# Patient Record
Sex: Female | Born: 1995 | Hispanic: No | Marital: Single | Smoking: Never smoker
Health system: Southern US, Community
[De-identification: ages and names within clinical notes are randomized; demographics above are authoritative.]

## PROBLEM LIST (undated history)

## (undated) DIAGNOSIS — F329 Major depressive disorder, single episode, unspecified: Secondary | ICD-10-CM

## (undated) DIAGNOSIS — F411 Generalized anxiety disorder: Secondary | ICD-10-CM

## (undated) DIAGNOSIS — F32A Depression, unspecified: Secondary | ICD-10-CM

## (undated) DIAGNOSIS — G4733 Obstructive sleep apnea (adult) (pediatric): Secondary | ICD-10-CM

## (undated) DIAGNOSIS — M2550 Pain in unspecified joint: Secondary | ICD-10-CM

## (undated) DIAGNOSIS — J309 Allergic rhinitis, unspecified: Secondary | ICD-10-CM

## (undated) DIAGNOSIS — M1990 Unspecified osteoarthritis, unspecified site: Secondary | ICD-10-CM

## (undated) HISTORY — DX: Major depressive disorder, single episode, unspecified: F32.9

## (undated) HISTORY — DX: Allergic rhinitis, unspecified: J30.9

## (undated) HISTORY — DX: Generalized anxiety disorder: F41.1

## (undated) HISTORY — DX: Unspecified osteoarthritis, unspecified site: M19.90

## (undated) HISTORY — DX: Pain in unspecified joint: M25.50

## (undated) HISTORY — DX: Depression, unspecified: F32.A

## (undated) HISTORY — PX: SUPRAVENTRICULAR TACHYCARDIA ABLATION: SHX6106

## (undated) HISTORY — PX: WISDOM TOOTH EXTRACTION: SHX21

## (undated) HISTORY — DX: Obstructive sleep apnea (adult) (pediatric): G47.33

---

## 2014-01-23 ENCOUNTER — Ambulatory Visit (INDEPENDENT_AMBULATORY_CARE_PROVIDER_SITE_OTHER): Payer: Commercial Indemnity | Admitting: Nurse Practitioner

## 2014-01-23 ENCOUNTER — Encounter: Payer: Self-pay | Admitting: Nurse Practitioner

## 2014-01-23 VITALS — BP 124/80 | HR 79 | Temp 97.9°F | Wt 124.4 lb

## 2014-01-23 DIAGNOSIS — G4452 New daily persistent headache (NDPH): Secondary | ICD-10-CM | POA: Insufficient documentation

## 2014-01-23 DIAGNOSIS — R51 Headache: Secondary | ICD-10-CM

## 2014-01-23 DIAGNOSIS — R21 Rash and other nonspecific skin eruption: Secondary | ICD-10-CM

## 2014-01-23 DIAGNOSIS — R509 Fever, unspecified: Secondary | ICD-10-CM

## 2014-01-23 LAB — CBC WITH DIFFERENTIAL/PLATELET
BASOS PCT: 0.4 % (ref 0.0–3.0)
Basophils Absolute: 0 10*3/uL (ref 0.0–0.1)
EOS PCT: 3.1 % (ref 0.0–5.0)
Eosinophils Absolute: 0.2 10*3/uL (ref 0.0–0.7)
HEMATOCRIT: 37.6 % (ref 36.0–49.0)
Hemoglobin: 12.6 g/dL (ref 12.0–16.0)
Lymphocytes Relative: 33.2 % (ref 24.0–48.0)
Lymphs Abs: 2 10*3/uL (ref 0.7–4.0)
MCHC: 33.5 g/dL (ref 31.0–37.0)
MCV: 79.7 fl (ref 78.0–98.0)
MONO ABS: 0.2 10*3/uL (ref 0.1–1.0)
MONOS PCT: 3.6 % (ref 3.0–12.0)
Neutro Abs: 3.7 10*3/uL (ref 1.4–7.7)
Neutrophils Relative %: 59.7 % (ref 43.0–71.0)
Platelets: 360 10*3/uL (ref 150.0–575.0)
RBC: 4.72 Mil/uL (ref 3.80–5.70)
RDW: 13.3 % (ref 11.4–15.5)
WBC: 6.2 10*3/uL (ref 4.5–13.5)

## 2014-01-23 LAB — URINALYSIS, MICROSCOPIC ONLY: WBC, UA: NONE SEEN (ref 0–?)

## 2014-01-23 LAB — COMPREHENSIVE METABOLIC PANEL
ALBUMIN: 3.9 g/dL (ref 3.5–5.2)
ALT: 16 U/L (ref 0–35)
AST: 24 U/L (ref 0–37)
Alkaline Phosphatase: 67 U/L (ref 47–119)
BUN: 9 mg/dL (ref 6–23)
CO2: 23 mEq/L (ref 19–32)
Calcium: 9.5 mg/dL (ref 8.4–10.5)
Chloride: 103 mEq/L (ref 96–112)
Creatinine, Ser: 0.9 mg/dL (ref 0.4–1.2)
GFR: 85.17 mL/min (ref >60.00)
Glucose, Bld: 88 mg/dL (ref 70–99)
Potassium: 3.9 mEq/L (ref 3.5–5.1)
Sodium: 136 mEq/L (ref 135–145)
TOTAL PROTEIN: 7.2 g/dL (ref 6.0–8.3)
Total Bilirubin: 0.4 mg/dL (ref 0.2–1.2)

## 2014-01-23 LAB — SEDIMENTATION RATE: SED RATE: 20 mm/h (ref 0–22)

## 2014-01-23 MED ORDER — TRIAMCINOLONE ACETONIDE 0.1 % EX CREA: 1.0000 "application " | TOPICAL_CREAM | Freq: Two times a day (BID) | CUTANEOUS | Status: DC

## 2014-01-23 NOTE — Progress Notes (Signed)
Pre visit review using our clinic review tool, if applicable. No additional management support is needed unless otherwise documented below in the visit note. 

## 2014-01-23 NOTE — Patient Instructions (Addendum)
Use steroid cream on rash as instructed.  Continue to use Dove bar soap.  Please see dermatology.  We will discuss labs at follow up appt. Next week.  Nice to meet you!

## 2014-01-23 NOTE — Progress Notes (Signed)
Subjective:     Tina Ortiz is a 18 y.o. female who presents w/c/o rash on legs & arms that started 3 weeks ago accompanied by fatigue, low grade fever, & HA. Last fever was 1 week ago. HA is persistent, but relieved by 1000 mg tylenol. She denies cough, SOB, palpitations, sore throat, runny nose. She denies rash r/t sun exposure, no diarrhea or nausea, no sores in mouth or nose. She also has a firm raised dome-shaped lesion on R upper thigh red w/layer of peeling skin. Pt says lesion has been there for 9 years. She has taken oral ABX & used ABX creams w/no relief. Of note: her father & PGM have SLE.   The following portions of the patient's history were reviewed and updated as appropriate: allergies, current medications, past family history, past medical history, past social history, past surgical history and problem list.  Review of Systems Pertinent items are noted in HPI.    Objective:    BP 124/80  Pulse 79  Temp(Src) 97.9 F (36.6 C) (Oral)  Wt 124 lb 6.4 oz (56.427 kg)  SpO2 100% BP 124/80  Pulse 79  Temp(Src) 97.9 F (36.6 C) (Oral)  Wt 124 lb 6.4 oz (56.427 kg)  SpO2 100% General appearance: alert, cooperative, appears stated age and no distress Head: Normocephalic, without obvious abnormality, atraumatic Eyes: negative findings: lids and lashes normal, conjunctivae and sclerae normal, corneas clear and pupils equal, round, reactive to light and accomodation Ears: normal TM's and external ear canals both ears Throat: lips, mucosa, and tongue normal; teeth and gums normal Neck: no carotid bruit, supple, symmetrical, trachea midline and thyroid not enlarged, symmetric, no tenderness/mass/nodules Lungs: clear to auscultation bilaterally Heart: regular rate and rhythm, S1, S2 normal, no murmur, click, rub or gallop Abdomen: soft, non-tender; bowel sounds normal; no masses,  no organomegaly Extremities: extremities normal, atraumatic, no cyanosis or edema Pulses: 2+ and  symmetric Skin: lesion upper thigh: acanthoma or pyogenic granuloma? superficial raised lesions LE, back, arms new & old, some scabbed over. New ones look like small hive w/central dot. Lymph nodes: Cervical adenopathy: shoddy anterior cervical nodes, NT, moveable.    Assessment:  1. Low grade fever - T4, free - TSH - Urine Microscopic - Vit D  25 hydroxy (rtn osteoporosis monitoring) - Comprehensive metabolic panel - Rheumatoid factor - C3 and C4 - Antinuclear Antibodies, IFA - RPR - HIV antibody - Sedimentation rate - CBC with Differential - ENA 9 Panel  2. Rash and nonspecific skin eruption - T4, free - TSH - Urine Microscopic - Vit D  25 hydroxy (rtn osteoporosis monitoring) - Comprehensive metabolic panel - Rheumatoid factor - C3 and C4 - Antinuclear Antibodies, IFA - RPR - HIV antibody - Sedimentation rate - CBC with Differential - ENA 9 Panel - triamcinolone cream (KENALOG) 0.1 %; Apply 1 application topically 2 (two) times daily. Use sparingly as can cause bleaching and atrophy.  Dispense: 45 g; Refill: 0 - Ambulatory referral to Dermatology  3. Headache(784.0) - T4, free - TSH - Urine Microscopic - Vit D  25 hydroxy (rtn osteoporosis monitoring) - Comprehensive metabolic panel - Rheumatoid factor - C3 and C4 - Antinuclear Antibodies, IFA - RPR - HIV antibody - Sedimentation rate - CBC with Differential - ENA 9 Panel  Screen for HIV, syphillis, SLE F/u 1 week-discuss labs, eval rash for steroid responsiveness

## 2014-01-24 ENCOUNTER — Telehealth: Payer: Self-pay | Admitting: Nurse Practitioner

## 2014-01-24 LAB — RPR

## 2014-01-24 LAB — ENA 9 PANEL
CENTROMERE AB SCREEN: NEGATIVE
ENA SM AB SER-ACNC: NEGATIVE
JO-1 ANTIBODY, IGG: NEGATIVE
RIBOSOMAL P PROTEIN AB: NEGATIVE
SM/RNP: <1
SSA (Ro) (ENA) Antibody, IgG: <1
SSB (La) (ENA) Antibody, IgG: <1
Scleroderma (Scl-70) (ENA) Antibody, IgG: <1

## 2014-01-24 LAB — C3 AND C4
C3 COMPLEMENT: 127 mg/dL (ref 90–180)
C4 Complement: 21 mg/dL (ref 10–40)

## 2014-01-24 LAB — HIV ANTIBODY (ROUTINE TESTING W REFLEX): HIV: NONREACTIVE

## 2014-01-24 LAB — TSH: TSH: 1.87 u[IU]/mL (ref 0.40–5.00)

## 2014-01-24 LAB — VITAMIN D 25 HYDROXY (VIT D DEFICIENCY, FRACTURES): VITD: 18.05 ng/mL — ABNORMAL LOW (ref 30.00–100.00)

## 2014-01-24 LAB — ANTINUCLEAR ANTIBODIES, IFA: ANA Titer 1: NEGATIVE

## 2014-01-24 LAB — T4, FREE: FREE T4: 0.99 ng/dL (ref 0.60–1.60)

## 2014-01-24 LAB — RHEUMATOID FACTOR: Rhuematoid fact SerPl-aCnc: <10 IU/mL (ref <=14)

## 2014-01-24 NOTE — Telephone Encounter (Signed)
Neg screen for SLE, syphillis & HIV. Sed rate elevated. Will f/u in ofc next week. LM for pt.

## 2014-01-25 ENCOUNTER — Telehealth: Payer: Self-pay | Admitting: *Deleted

## 2014-01-25 NOTE — Telephone Encounter (Signed)
Patient returned call and was given results.  

## 2014-02-03 ENCOUNTER — Encounter: Payer: Self-pay | Admitting: Nurse Practitioner

## 2014-02-03 ENCOUNTER — Ambulatory Visit (INDEPENDENT_AMBULATORY_CARE_PROVIDER_SITE_OTHER): Payer: Commercial Indemnity | Admitting: Nurse Practitioner

## 2014-02-03 VITALS — BP 113/70 | HR 66 | Temp 98.7°F | Ht 62.0 in | Wt 125.2 lb

## 2014-02-03 DIAGNOSIS — M542 Cervicalgia: Secondary | ICD-10-CM

## 2014-02-03 DIAGNOSIS — R312 Other microscopic hematuria: Secondary | ICD-10-CM

## 2014-02-03 DIAGNOSIS — R3129 Other microscopic hematuria: Secondary | ICD-10-CM | POA: Insufficient documentation

## 2014-02-03 DIAGNOSIS — G4452 New daily persistent headache (NDPH): Secondary | ICD-10-CM

## 2014-02-03 DIAGNOSIS — R21 Rash and other nonspecific skin eruption: Secondary | ICD-10-CM

## 2014-02-03 DIAGNOSIS — E559 Vitamin D deficiency, unspecified: Secondary | ICD-10-CM | POA: Insufficient documentation

## 2014-02-03 LAB — URINALYSIS, MICROSCOPIC ONLY

## 2014-02-03 NOTE — Progress Notes (Signed)
Pre visit review using our clinic review tool, if applicable. No additional management support is needed unless otherwise documented below in the visit note. 

## 2014-02-03 NOTE — Progress Notes (Signed)
Subjective:     Tina Ortiz is a 18 y.o. female who presents for f/u of associated with HA & low grade fever. She was seen 2 weeks ago. AI w/u performed as father has SLE. W/u neg for SLE, but sed rate 20, few RBC in urine. I referred to derm & prescribed steroid cream.  Today, states HA are still persistent & tylenol is not working as well. HA wake from sleep & persiste throughout day.  Fevers have resolved, rash is persistent, but has changed-no longer starts as pink scaly lesions. Now they start as papules. They are no longer itchy. She has new complaint of neck pain & pops neck several times daily.  The following portions of the patient's history were reviewed and updated as appropriate: allergies, current medications, past family history, past medical history, past social history, past surgical history and problem list.  Review of Systems Pertinent items are noted in HPI.    Objective:    BP 113/70  Pulse 66  Temp(Src) 98.7 F (37.1 C) (Temporal)  Ht 5\' 2"  (1.575 m)  Wt 125 lb 4 oz (56.813 kg)  BMI 22.90 kg/m2  SpO2 100% General:  alert, cooperative, appears stated age and no distress  Skin:  Lungs: Heart: Back:  Lymph nodes Neuro: Eyes:  post-inflammatory changes where old papules were on legs, new cluster of papules on arm-no vesicles visible. Clear RRR, no murmur No spinal tenderness, tendre adjacent to spine on L side over traps & splenius capitis  No cervical & [place] LAD Cranial nerves grossly intact, nml rapid-alternating movements PERRL     Assessment:   1. New daily persistent headache - HSV(herpes simplex vrs) 1+2 ab-IgG - HSV(herpes simplex vrs) 1+2 ab-IgM - Rocky mtn spotted fvr abs pnl(IgG+IgM) - B. [place] Antibodies by WB  2. Hematuria, microscopic - Urine Microscopic  3. Vitamin D deficiency  4. Rash and nonspecific skin eruption  5. Neck pain, musculoskeletal  See problem list for complete A&P See pt instructions. F/u 2-3 weeks

## 2014-02-03 NOTE — Assessment & Plan Note (Addendum)
Rash has changed, no longer pink scaly spots, now starting as clusters of papules. Fewer lesions than intiially. HSV? Will screen. SLE w/u neg, sed rate 20. Will monitor.

## 2014-02-03 NOTE — Assessment & Plan Note (Signed)
Repeat today

## 2014-02-03 NOTE — Assessment & Plan Note (Signed)
No nuchal rigidity. MSK etiology. Neck stretches & ibuprophen. F/u 2 weeks.

## 2014-02-03 NOTE — Assessment & Plan Note (Signed)
SLE w/u neg, but sed rate 20. Look for infectious source, given she reported fever when 1st started, now resolved. Will screen for HSV, RMSF, Lyme. Discussed HA journal, sleep routine, hydration. F/u 2 weeks.

## 2014-02-03 NOTE — Patient Instructions (Signed)
Start Vitamin D3 daily 5000 iu with meal.  I will call with lab results.  Keep appointment for dermatology.  Start ibuprophen for headache. Take 2 to 3 tablets twice daily with food.  There are many triggers for headaches: lack of sleep, too much sleep, food allergies, hormones, stress, poor posture, not enough exercise, dehydration. Try to go to sleep & wake up at within 30 minutes of same time daily. If I cannot find an infectious source for headache, or a musculoskeletal cause, and headaches are not relieved by over-the- counter meds, I will consider ordering head CT scan.  Please record headache journal foods & beverages, sleep times, recreational drugs, alcohol intake within 24 hours of headaches. Perhaps we can find a pattern or trigger.  Do neck & shoulder exercises daily. Visit you tube for these exercise regimens: "physio Neck Exercises: Stretch & Relieve routine" by Dyane DustmanMichelle Kenway; "Pilates Seated Stretch" by [employer]; "Physio med Neck & Upper body stretch Exercises-Occupational Physiotherapy". Use muscle relaxer at night. Get up every hour while at work or studying & do neck & shoulder exercises-take 5-7 minutes.   See you in 2 weeks.

## 2014-02-03 NOTE — Assessment & Plan Note (Signed)
Start 5000 iu D3 daily w/meal X 12 weeks.

## 2014-02-06 LAB — HSV(HERPES SIMPLEX VRS) I + II AB-IGG
HSV 1 Glycoprotein G Ab, IgG: <0.1 IV
HSV 2 Glycoprotein G Ab, IgG: <0.1 IV

## 2014-02-06 LAB — HSV(HERPES SIMPLEX VRS) I + II AB-IGM: HERPES SIMPLEX VRS I-IGM AB (EIA): 0.56 {index}

## 2014-02-07 LAB — ROCKY MTN SPOTTED FVR ABS PNL(IGG+IGM)
RMSF IGG: 0.1 IV
RMSF IgM: 0.48 IV

## 2014-02-08 ENCOUNTER — Telehealth: Payer: Self-pay | Admitting: Nurse Practitioner

## 2014-02-08 LAB — B. BURGDORFI ANTIBODIES BY WB
B BURGDORFERI IGG ABS (IB): NEGATIVE
B BURGDORFERI IGM ABS (IB): NEGATIVE

## 2014-02-08 NOTE — Telephone Encounter (Signed)
pls call pt: Advise Labs for infectious source of HA are all neg. I checked lyme, herpes simplex, & [place] spotted fever.  Please ask if still having HA. Has ibuprophen helped? How long has she been on current birth control?  I will discuss further at return appointment. Bring HA journal.

## 2014-02-09 NOTE — Telephone Encounter (Addendum)
Patient notified of results. Patient stated that the Advil helps with HA, however she is still having them. Patient wanted to know if you thought flea bites could cause infection and HA symptoms? Patient also stated that she has been on curent birth control since 04/2013.

## 2014-02-10 NOTE — Telephone Encounter (Signed)
Patient notified

## 2014-02-10 NOTE — Telephone Encounter (Signed)
No infection w/flea bites that would cause HA. I will discuss with her further at follow up. Perhaps she has fall allergies. She can try claritin daily.

## 2014-02-15 ENCOUNTER — Ambulatory Visit (INDEPENDENT_AMBULATORY_CARE_PROVIDER_SITE_OTHER): Payer: Commercial Indemnity | Admitting: Nurse Practitioner

## 2014-02-15 ENCOUNTER — Encounter: Payer: Self-pay | Admitting: Nurse Practitioner

## 2014-02-15 VITALS — BP 95/61 | HR 80 | Temp 98.0°F | Ht 62.0 in | Wt 127.0 lb

## 2014-02-15 DIAGNOSIS — R3129 Other microscopic hematuria: Secondary | ICD-10-CM

## 2014-02-15 DIAGNOSIS — R519 Headache, unspecified: Secondary | ICD-10-CM | POA: Insufficient documentation

## 2014-02-15 DIAGNOSIS — R51 Headache: Secondary | ICD-10-CM

## 2014-02-15 DIAGNOSIS — S80869A Insect bite (nonvenomous), unspecified lower leg, initial encounter: Secondary | ICD-10-CM

## 2014-02-15 DIAGNOSIS — W57XXXA Bitten or stung by nonvenomous insect and other nonvenomous arthropods, initial encounter: Secondary | ICD-10-CM

## 2014-02-15 DIAGNOSIS — G44209 Tension-type headache, unspecified, not intractable: Secondary | ICD-10-CM

## 2014-02-15 DIAGNOSIS — R312 Other microscopic hematuria: Secondary | ICD-10-CM

## 2014-02-15 DIAGNOSIS — E559 Vitamin D deficiency, unspecified: Secondary | ICD-10-CM

## 2014-02-15 NOTE — Patient Instructions (Signed)
Continue to use ibuprophen as needed for Headache. Take it with food.  Stay hydrated to avoid HA.  Return in 2 to 3 months for vitamin D check & birth control.  Keep taking Vitamin D daily with food.   Nice to see you!

## 2014-02-15 NOTE — Assessment & Plan Note (Addendum)
Monitor in 6 mos to 1 yr.

## 2014-02-15 NOTE — Progress Notes (Signed)
Pre visit review using our clinic review tool, if applicable. No additional management support is needed unless otherwise documented below in the visit note. 

## 2014-02-15 NOTE — Progress Notes (Signed)
Subjective:     Tina Ortiz is a 18 y.o. female presents for follow up of daily headaches & rash on lower extremities. She has figured out that rash is from flea bites. 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 She has fewer lesions. HA are less frequent-decreased from daily to  2 to 3 week. She is using ibuprophen rather than tylenol and getting complete relief. She wonders if HA were r/t stress of not knowing what was causing rash. Pain is described as across forehead. & back of neck. Mostly occur now when at work. We discussed birth control. She has no risk factors for stroke. She will return in January for refill. She is taking Vitamin D daily. I will check level when she returns in Jan., 2016.  The following portions of the patient's history were reviewed and updated as appropriate: allergies, current medications, past family history, past medical history, past social history, past surgical history and problem list.  Review of Systems Pertinent items are noted in HPI.    Objective:    BP 95/61  Pulse 80  Temp(Src) 98 F (36.7 C) (Temporal)  Ht 5\' 2"  (1.575 m)  Wt 127 lb (57.607 kg)  BMI 23.22 kg/m2  SpO2 99% BP 95/61  Pulse 80  Temp(Src) 98 F (36.7 C) (Temporal)  Ht 5\' 2"  (1.575 m)  Wt 127 lb (57.607 kg)  BMI 23.22 kg/m2  SpO2 99% General appearance: alert, cooperative, appears stated age and no distress Head: Normocephalic, without obvious abnormality, atraumatic Eyes: negative findings: lids and lashes normal and conjunctivae and sclerae normal    Assessment:   1. Tension-type headache, not intractable, unspecified chronicity pattern  2. Flea bite of lower leg, unspecified laterality, initial encounter  3. Vitamin D deficiency See problem list for complete A&P See pt instructions. F/u 3 mos check Vit D, sed rate.

## 2014-02-15 NOTE — Assessment & Plan Note (Signed)
Complete relief w/ ibuprophen Decrease in frequency now that she knows what rash is coming from-fleas: 2/week.

## 2014-02-15 NOTE — Assessment & Plan Note (Signed)
Continue 5000 iu daily Check level in 3 mos.

## 2014-05-03 ENCOUNTER — Ambulatory Visit (INDEPENDENT_AMBULATORY_CARE_PROVIDER_SITE_OTHER): Payer: Commercial Indemnity | Admitting: Nurse Practitioner

## 2014-05-03 ENCOUNTER — Encounter: Payer: Self-pay | Admitting: Nurse Practitioner

## 2014-05-03 VITALS — BP 107/68 | HR 77 | Temp 98.2°F | Ht 62.0 in | Wt 123.0 lb

## 2014-05-03 DIAGNOSIS — R3129 Other microscopic hematuria: Secondary | ICD-10-CM

## 2014-05-03 DIAGNOSIS — M542 Cervicalgia: Secondary | ICD-10-CM

## 2014-05-03 DIAGNOSIS — R312 Other microscopic hematuria: Secondary | ICD-10-CM

## 2014-05-03 DIAGNOSIS — Z309 Encounter for contraceptive management, unspecified: Secondary | ICD-10-CM

## 2014-05-03 DIAGNOSIS — M6248 Contracture of muscle, other site: Secondary | ICD-10-CM

## 2014-05-03 DIAGNOSIS — M62838 Other muscle spasm: Secondary | ICD-10-CM

## 2014-05-03 DIAGNOSIS — E559 Vitamin D deficiency, unspecified: Secondary | ICD-10-CM

## 2014-05-03 DIAGNOSIS — IMO0001 Reserved for inherently not codable concepts without codable children: Secondary | ICD-10-CM | POA: Insufficient documentation

## 2014-05-03 LAB — URINALYSIS, MICROSCOPIC ONLY

## 2014-05-03 LAB — VITAMIN D 25 HYDROXY (VIT D DEFICIENCY, FRACTURES): VITD: 18.66 ng/mL — ABNORMAL LOW (ref 30.00–100.00)

## 2014-05-03 MED ORDER — LEVONORGESTREL-ETHINYL ESTRAD 0.1-20 MG-MCG PO TABS: 1.0000 | ORAL_TABLET | Freq: Every day | ORAL | Status: DC

## 2014-05-03 NOTE — Progress Notes (Signed)
Subjective:     Tina Ortiz is a 19 y.o. female presents for f/u of vit d def, contraception management. She c/o persistent HA associated w/neck pain. Vit D def: takes 5000 iu most days. No intol SE. Contraception Management: No intol SE, wishes to continue HA: occur back of head 2-3 times/week. Associated w/neck & trap pain. Denies aura & nausea. Complete relief w/2 T ibuprophen, sometimes has to repeat dose in 4 hours. Denies paresthesia in arms/hands. Stretches about every 2h-provides some relief. Cannot ID triggers. Just had eye exam 3 weeks ago, slight change in prescription.  The following portions of the patient's history were reviewed and updated as appropriate: allergies, current medications, past medical history, past social history, past surgical history and problem list.  Review of Systems Constitutional: negative for fatigue Respiratory: negative for cough and dyspnea on exertion Cardiovascular: negative for palpitations Integument/breast: positive for post-inflam changes from flea bites-arms & legs Neurological: negative for coordination problems and dizziness    Objective:    BP 107/68 mmHg  Pulse 77  Temp(Src) 98.2 F (36.8 C) (Temporal)  Ht 5\' 2"  (1.575 m)  Wt 123 lb (55.792 kg)  BMI 22.49 kg/m2  SpO2 100%  LMP 04/19/2014 General appearance: alert, cooperative, appears stated age and no distress Head: Normocephalic, without obvious abnormality, atraumatic Eyes: negative findings: lids and lashes normal and conjunctivae and sclerae normal Back: Full ROM neck & shoulders. No spinal tenderness.tender over bilat trap.    Assessment:Plan     1. Vitamin D deficiency - Vit D  25 hydroxy (rtn osteoporosis monitoring)  2. Hematuria, microscopic - Urine Microscopic  3. Encounter for contraceptive management, unspecified encounter - levonorgestrel-ethinyl estradiol (SRONYX) 0.1-20 MG-MCG tablet; Take 1 tablet by mouth daily.  Dispense: 1 Package; Refill: 11  4.  Trapezius muscle spasm - Ambulatory referral to Physical Therapy  See problem list for complete A&P See pt instructions. F/u 1 yr

## 2014-05-03 NOTE — Progress Notes (Signed)
Pre visit review using our clinic review tool, if applicable. No additional management support is needed unless otherwise documented below in the visit note. 

## 2014-05-03 NOTE — Assessment & Plan Note (Signed)
Taking 5000 iu daily, forgets some days No intolerable SE

## 2014-05-03 NOTE — Assessment & Plan Note (Signed)
No symptoms [facility] today [facility]

## 2014-05-03 NOTE — Assessment & Plan Note (Signed)
Persistent pain, relieved by ibuprophen Associated w/HA Doing stretches-minimally helpful Use heat Posture-head up, shoulders back Ref to PT

## 2014-05-03 NOTE — Assessment & Plan Note (Signed)
No intolerable SE Wishes to continue.

## 2014-05-03 NOTE — Patient Instructions (Addendum)
Use heat on shoulders & neck. Be aware of posture-keep head up & pull shoulders back. Frequent stretches of head & neck. See physical therapist.  Continue vitamin D daily with meal.  I will see you in 1 year.

## 2014-05-04 ENCOUNTER — Telehealth: Payer: Self-pay | Admitting: Nurse Practitioner

## 2014-05-04 NOTE — Telephone Encounter (Signed)
LMOVM for pt to return call 

## 2014-05-04 NOTE — Addendum Note (Signed)
Addended by: Cydney OkAUGUSTIN, Iyesha Such N on: 05/04/2014 08:38 AM   Modules accepted: Orders

## 2014-05-04 NOTE — Telephone Encounter (Signed)
pls call pt: Advise Vit D too low. Must take 5000 iu qd with meal. If she tends to forget, take 2 capsules when she remembers. Urine has bacteria, but may be from vaginal secretions. I sent for culture to screen for uti. We will call if she has infection & needs ABX.

## 2014-05-05 LAB — URINE CULTURE
Colony Count: NO GROWTH
Organism ID, Bacteria: NO GROWTH

## 2014-05-05 NOTE — Telephone Encounter (Signed)
Patient notified of results.

## 2014-05-06 ENCOUNTER — Other Ambulatory Visit: Payer: Self-pay | Admitting: Advanced Practice Midwife

## 2014-05-08 ENCOUNTER — Telehealth: Payer: Self-pay | Admitting: Nurse Practitioner

## 2014-05-08 NOTE — Telephone Encounter (Signed)
pls call pt: Advise No UTI. Ua probably had vag secretions.

## 2014-05-08 NOTE — Telephone Encounter (Signed)
Left detailed message on pt's vm. Per pt's DPR. 

## 2014-05-11 ENCOUNTER — Encounter: Payer: Self-pay | Admitting: Nurse Practitioner

## 2014-05-22 ENCOUNTER — Ambulatory Visit: Payer: Commercial Indemnity | Admitting: Physical Therapy

## 2014-11-17 ENCOUNTER — Ambulatory Visit (INDEPENDENT_AMBULATORY_CARE_PROVIDER_SITE_OTHER): Payer: Commercial Indemnity | Admitting: Family Medicine

## 2014-11-17 ENCOUNTER — Encounter: Payer: Self-pay | Admitting: Family Medicine

## 2014-11-17 VITALS — BP 115/75 | HR 76 | Temp 98.0°F | Resp 16 | Ht 62.0 in | Wt 127.0 lb

## 2014-11-17 DIAGNOSIS — M79642 Pain in left hand: Secondary | ICD-10-CM | POA: Diagnosis not present

## 2014-11-17 DIAGNOSIS — M542 Cervicalgia: Secondary | ICD-10-CM | POA: Diagnosis not present

## 2014-11-17 DIAGNOSIS — G44219 Episodic tension-type headache, not intractable: Secondary | ICD-10-CM

## 2014-11-17 DIAGNOSIS — M79641 Pain in right hand: Secondary | ICD-10-CM

## 2014-11-17 DIAGNOSIS — G8929 Other chronic pain: Secondary | ICD-10-CM

## 2014-11-17 DIAGNOSIS — R21 Rash and other nonspecific skin eruption: Secondary | ICD-10-CM | POA: Diagnosis not present

## 2014-11-17 DIAGNOSIS — R509 Fever, unspecified: Secondary | ICD-10-CM

## 2014-11-17 DIAGNOSIS — M25542 Pain in joints of left hand: Principal | ICD-10-CM

## 2014-11-17 DIAGNOSIS — M25541 Pain in joints of right hand: Secondary | ICD-10-CM

## 2014-11-17 LAB — URINALYSIS, ROUTINE W REFLEX MICROSCOPIC
BILIRUBIN URINE: NEGATIVE
Ketones, ur: NEGATIVE
Nitrite: NEGATIVE
Specific Gravity, Urine: >=1.03 — AB (ref 1.000–1.030)
Urine Glucose: NEGATIVE
Urobilinogen, UA: 0.2 (ref 0.0–1.0)
pH: 5.5 (ref 5.0–8.0)

## 2014-11-17 LAB — COMPREHENSIVE METABOLIC PANEL
ALBUMIN: 3.9 g/dL (ref 3.5–5.2)
ALK PHOS: 67 U/L (ref 39–117)
ALT: 10 U/L (ref 0–35)
AST: 14 U/L (ref 0–37)
BUN: 9 mg/dL (ref 6–23)
CHLORIDE: 103 meq/L (ref 96–112)
CO2: 25 mEq/L (ref 19–32)
Calcium: 9.1 mg/dL (ref 8.4–10.5)
Creatinine, Ser: 0.86 mg/dL (ref 0.40–1.20)
GFR: 89.02 mL/min (ref >60.00)
Glucose, Bld: 80 mg/dL (ref 70–99)
POTASSIUM: 4 meq/L (ref 3.5–5.1)
Sodium: 136 mEq/L (ref 135–145)
Total Bilirubin: 0.4 mg/dL (ref 0.2–1.2)
Total Protein: 6.4 g/dL (ref 6.0–8.3)

## 2014-11-17 LAB — CBC WITH DIFFERENTIAL/PLATELET
Basophils Absolute: 0.1 10*3/uL (ref 0.0–0.1)
Basophils Relative: 1 % (ref 0.0–3.0)
Eosinophils Absolute: 0.2 10*3/uL (ref 0.0–0.7)
Eosinophils Relative: 4.2 % (ref 0.0–5.0)
HCT: 37.7 % (ref 36.0–46.0)
Hemoglobin: 12.4 g/dL (ref 12.0–15.0)
Lymphocytes Relative: 26 % (ref 12.0–46.0)
Lymphs Abs: 1.3 10*3/uL (ref 0.7–4.0)
MCHC: 33 g/dL (ref 30.0–36.0)
MCV: 79.3 fl (ref 78.0–100.0)
Monocytes Absolute: 0.3 10*3/uL (ref 0.1–1.0)
Monocytes Relative: 4.9 % (ref 3.0–12.0)
Neutro Abs: 3.3 10*3/uL (ref 1.4–7.7)
Neutrophils Relative %: 63.9 % (ref 43.0–77.0)
Platelets: 355 10*3/uL (ref 150.0–400.0)
RBC: 4.75 Mil/uL (ref 3.87–5.11)
RDW: 14.8 % — ABNORMAL HIGH (ref 11.5–14.6)
WBC: 5.1 10*3/uL (ref 4.5–10.5)

## 2014-11-17 LAB — C-REACTIVE PROTEIN: CRP: 2.3 mg/dL (ref 0.5–20.0)

## 2014-11-17 LAB — SEDIMENTATION RATE: Sed Rate: 28 mm/hr — ABNORMAL HIGH (ref 0–22)

## 2014-11-17 MED ORDER — TRAMADOL-ACETAMINOPHEN 37.5-325 MG PO TABS: 1.0000 | ORAL_TABLET | Freq: Four times a day (QID) | ORAL | Status: DC | PRN

## 2014-11-17 MED ORDER — FLUTICASONE PROPIONATE 0.05 % EX CREA: TOPICAL_CREAM | CUTANEOUS | Status: DC

## 2014-11-17 NOTE — Progress Notes (Signed)
Pre visit review using our clinic review tool, if applicable. No additional management support is needed unless otherwise documented below in the visit note. 

## 2014-11-17 NOTE — Progress Notes (Signed)
OFFICE VISIT  11/19/2014   CC:  Chief Complaint  Patient presents with  . Fever    x 3 weeks, off and on  . Rash    x 1 week.    HPI:    Patient is a 19 y.o. Caucasian female who presents for a multitude of symptoms: HA's, pain in neck and joints of fingers and T around 100 intermittently: onset about a year ago. FH of lupus: father and GM.  Lots of testing for autoimmune disorders was done at initial presentation and these were normal/negative.   The pain in neck and hands has continued.  She had been taking lots of advil up until a few months ago. Seems to have worsened in last month or so.  Has had some jaw pain/popping lately.   About 3 wks ago she had slight rash around inferolateral aspect of orbital regions (not malar-"butterfly" rash), followed by watery/itchy eyes, and then the sensation of soreness in ear canals and in nose.  Says the whites of her eyes were reddish at the time. Currently has rash on hands/fingers, elbows--extended up extensor aspect of both upper arms, some behind R ear.  The rash sometimes itches.  Not applying anything to rash.  Rash looked like diffusely reddish skin, bumps came up after pt itched it some. She has felt slightly febrile lately on and off, esp when rash is worse, but has not checked temp. Has also had return of headaches: tension around orbital regions and forehead and also pain in back of head.  Some dizziness and nausea at work for brief periods when HA was bad.  SHe has not taken anything for her HA.  Past Medical History  Diagnosis Date  . Depression     Past Surgical History  Procedure Laterality Date  . Wisdom tooth extraction      Outpatient Prescriptions Prior to Visit  Medication Sig Dispense Refill  . levonorgestrel-ethinyl estradiol (SRONYX) 0.1-20 MG-MCG tablet Take 1 tablet by mouth daily. 1 Package 11  . triamcinolone cream (KENALOG) 0.1 % Apply 1 application topically 2 (two) times daily. Use sparingly as can cause  bleaching and atrophy. (Patient not taking: Reported on 11/17/2014) 45 g 0   No facility-administered medications prior to visit.    No Known Allergies  ROS As per HPI  PE: Blood pressure 115/75, pulse 76, temperature 98 F (36.7 C), temperature source Oral, resp. rate 16, height 5' 2"  (1.575 m), weight 127 lb (57.607 kg), last menstrual period 11/03/2014, SpO2 100 %.  Pt examined with Sharen Hones, CMA, as chaperone. Gen: Alert, well appearing.  Patient is oriented to person, place, time, and situation. HEENT: Head/scalp w/out rash.  Ears: EACs clear, normal epithelium.  TMs with good light reflex and landmarks bilaterally.  Eyes: no injection, icteris, swelling, or exudate.  EOMI, PERRLA. Nose: no drainage or turbinate edema/swelling.  No injection or focal lesion.  Mouth: lips without lesion/swelling.  Oral mucosa pink and moist.  Dentition intact and without obvious caries or gingival swelling.  Oropharynx without erythema, exudate, or swelling.  Neck - No masses or thyromegaly or limitation in range of motion.  Posterior midline tenderness to palpation along inferior 1/3 of C spine. CV: RRR, no m/r/g.   LUNGS: CTA bilat, nonlabored resps, good aeration in all lung fields. ABD: soft, NT, ND, BS normal.  No hepatospenomegaly or mass.  No bruits. EXT: no clubbing, cyanosis, or edema.  SKIN: pinkish maculopapular rash on extensor surfaces of upper arms, back of  both elbows--one elbow with a bit of white hyperkeratosis/scaling.  Hands with similar rash but very sparse and only on dorsal surfaces of a few fingers.   No swelling, erythema, or warmth of any joints.  No joint tenderness.  LABS:  None today  IMPRESSION AND PLAN:  Rash with chronic neck and hand arthralgias and intermittent fevers and headaches. Exam normal other than rash. History concerning for autoimmune/connective tissue dz: will recheck ESR, CRP, ANA/autoimmune panel, CBC, CMET.   Also check HLA B 27.  Refer to  rheumatologist for further evaluation. Symptomatic care of HAs and rash: ultracet and cutivate (see orders).  An After Visit Summary was printed and given to the patient.   FOLLOW UP: Return in about 4 weeks (around 12/15/2014) for f/u rheum sx's.

## 2014-11-18 ENCOUNTER — Emergency Department (HOSPITAL_BASED_OUTPATIENT_CLINIC_OR_DEPARTMENT_OTHER)
Admission: EM | Admit: 2014-11-18 | Discharge: 2014-11-18 | Disposition: A | Discharge status: Home / Self Care | Payer: Commercial Indemnity | Attending: Emergency Medicine | Admitting: Emergency Medicine

## 2014-11-18 ENCOUNTER — Encounter (HOSPITAL_BASED_OUTPATIENT_CLINIC_OR_DEPARTMENT_OTHER): Payer: Self-pay | Admitting: Emergency Medicine

## 2014-11-18 DIAGNOSIS — F329 Major depressive disorder, single episode, unspecified: Secondary | ICD-10-CM | POA: Insufficient documentation

## 2014-11-18 DIAGNOSIS — R21 Rash and other nonspecific skin eruption: Secondary | ICD-10-CM | POA: Diagnosis not present

## 2014-11-18 DIAGNOSIS — N390 Urinary tract infection, site not specified: Secondary | ICD-10-CM

## 2014-11-18 DIAGNOSIS — R339 Retention of urine, unspecified: Secondary | ICD-10-CM | POA: Diagnosis present

## 2014-11-18 DIAGNOSIS — Z793 Long term (current) use of hormonal contraceptives: Secondary | ICD-10-CM | POA: Diagnosis not present

## 2014-11-18 DIAGNOSIS — K5900 Constipation, unspecified: Secondary | ICD-10-CM | POA: Diagnosis not present

## 2014-11-18 DIAGNOSIS — Z3202 Encounter for pregnancy test, result negative: Secondary | ICD-10-CM | POA: Insufficient documentation

## 2014-11-18 LAB — ANA COMPREHENSIVE PANEL
Anti JO-1: <0.2 AI (ref 0.0–0.9)
Chromatin Ab SerPl-aCnc: <0.2 AI (ref 0.0–0.9)
ENA RNP Ab: <0.2 AI (ref 0.0–0.9)
ENA SSB (LA) Ab: <0.2 AI (ref 0.0–0.9)

## 2014-11-18 LAB — URINALYSIS, ROUTINE W REFLEX MICROSCOPIC
Bilirubin Urine: NEGATIVE
Glucose, UA: NEGATIVE mg/dL
Hgb urine dipstick: NEGATIVE
Ketones, ur: NEGATIVE mg/dL
NITRITE: NEGATIVE
PH: 7 (ref 5.0–8.0)
Protein, ur: NEGATIVE mg/dL
Specific Gravity, Urine: 1.026 (ref 1.005–1.030)
Urobilinogen, UA: 1 mg/dL (ref 0.0–1.0)

## 2014-11-18 LAB — URINE MICROSCOPIC-ADD ON

## 2014-11-18 LAB — PREGNANCY, URINE: PREG TEST UR: NEGATIVE

## 2014-11-18 MED ORDER — CEFTRIAXONE SODIUM 1 G IJ SOLR
INTRAMUSCULAR | Status: AC
  Filled 2014-11-18: qty 10

## 2014-11-18 MED ORDER — DEXTROSE 5 % IV SOLN: 1.0000 g | Freq: Once | INTRAVENOUS | Status: DC

## 2014-11-18 MED ORDER — CEPHALEXIN 250 MG PO CAPS
500.0000 mg | ORAL_CAPSULE | Freq: Once | ORAL | Status: AC
  Administered 2014-11-18: 500 mg via ORAL
  Filled 2014-11-18: qty 2

## 2014-11-18 MED ORDER — CEPHALEXIN 500 MG PO CAPS: 500.0000 mg | ORAL_CAPSULE | Freq: Four times a day (QID) | ORAL | Status: DC

## 2014-11-18 NOTE — ED Notes (Signed)
Pt states has had trouble urinating for past 2 days.  Denies any blood noticed in urine or cloudiness or foul smell.  Pt seen by PMD yesterday and gave urine sample but did not receive results of that.  Pt states very little urination in 24 hours along with right side abdominal pain.

## 2014-11-18 NOTE — Discharge Instructions (Signed)

## 2014-11-18 NOTE — ED Provider Notes (Signed)
CSN: [id number]     Arrival date & time 11/18/14  1745 History  This chart was scribed for Eber Hong, MD by Abel Presto, ED Scribe. This patient was seen in room MH08/MH08 and the patient's care was started at 6:18 PM.     Chief Complaint  Patient presents with  . Urinary Retention  . Abdominal Pain    The history is provided by the patient. No language interpreter was used.   HPI Comments: Tina Ortiz is a 19 y.o. female who presents to the Emergency Department complaining of urinary retention with onset yesterday and right lateral abdominal pain with onset this morning. She notes she has voided with small amount of urine today and has been unable to defecate. She reports pain in abdomen with deep inspiration. Pt also states symptoms yesterday included urinary frequency with minimal output. Pt reports intermittent headaches and joint pain with onset September 2015. Pt was evaluated for Lupus and several STDs with no significant findings. She reports her father has Lupus. She notes recent jaw pain. Pt saw dentist for evaluation. Pt presenting with rash to bilateral upper extremities and behind ears, eye itching, eye redness, and eye swelling.  Pt was seen by PCP yesterday for evaluation of above symtpoms and given referral to a rheumatologist as PCP is concerned for an autoimmune disease. She was given Rx for a cream and Tramadol. She states she did not notice onset of urinary symptoms until asked to provide a urine sample at office. Pt has not taken NSAIDs for relief. She denies xerostomia and nausea.  Past Medical History  Diagnosis Date  . Depression    Past Surgical History  Procedure Laterality Date  . Wisdom tooth extraction     Family History  Problem Relation Age of Onset  . Thyroid disease Mother   . Lupus Father   . Hypertension Father   . Arthritis Maternal Grandmother     osteo  . Diabetes Maternal Grandmother   . Alcohol abuse Maternal Grandfather   . Lupus Paternal  Grandmother   . Hyperlipidemia Paternal Grandmother    History  Substance Use Topics  . Smoking status: Never Smoker   . Smokeless tobacco: Not on file  . Alcohol Use: Yes     Comment: occasional   OB History    No data available     Review of Systems  Gastrointestinal: Positive for abdominal pain and constipation. Negative for nausea.  Genitourinary: Positive for decreased urine volume and difficulty urinating.  All other systems reviewed and are negative.     Allergies  Review of patient's allergies indicates no known allergies.  Home Medications   Prior to Admission medications   Medication Sig Start Date End Date Taking? Authorizing Provider  cephALEXin (KEFLEX) 500 MG capsule Take 1 capsule (500 mg total) by mouth 4 (four) times daily. 11/18/14   Eber Hong, MD  fluticasone (CUTIVATE) 0.05 % cream Apply to affected areas bid prn 11/17/14   Jeoffrey Massed, MD  levonorgestrel-ethinyl estradiol (SRONYX) 0.1-20 MG-MCG tablet Take 1 tablet by mouth daily. 05/03/14   Kelle Darting, NP  traMADol-acetaminophen (ULTRACET) 37.5-325 MG per tablet Take 1-2 tablets by mouth every 6 (six) hours as needed. 11/17/14   Jeoffrey Massed, MD   BP 117/63 mmHg  Pulse 71  Temp(Src) 98.2 F (36.8 C) (Oral)  Resp 18  Ht 5\' 2"  (1.575 m)  Wt 127 lb (57.607 kg)  BMI 23.22 kg/m2  SpO2 100%  LMP 11/03/2014 Physical Exam  Constitutional: She appears well-developed and well-nourished.  HENT:  Head: Normocephalic and atraumatic.  Eyes: Conjunctivae are normal. Right eye exhibits no discharge. Left eye exhibits no discharge.  Pulmonary/Chest: Effort normal. No respiratory distress.  Abdominal: There is tenderness ( minmal SP ttp, no other ttp).  Neurological: She is alert. Coordination normal.  Skin: Skin is warm and dry. Rash ( R arm - erthermatous adn blanching - non tender) noted. She is not diaphoretic. No erythema.  Psychiatric: She has a normal mood and affect.  Nursing note and  vitals reviewed.   ED Course  Procedures (including critical care time) DIAGNOSTIC STUDIES: Oxygen Saturation is 100% on room air, normal by my interpretation.    COORDINATION OF CARE: 6:24 PM Discussed treatment plan with patient at beside, the patient agrees with the plan and has no further questions at this time.   Labs Review Labs Reviewed  URINALYSIS, ROUTINE W REFLEX MICROSCOPIC (NOT AT [facility]) - Abnormal; Notable for the following:    APPearance CLOUDY (*)    Leukocytes, UA MODERATE (*)    All other components within normal limits  URINE MICROSCOPIC-ADD ON - Abnormal; Notable for the following:    Squamous Epithelial / LPF MANY (*)    Bacteria, UA MANY (*)    All other components within normal limits  URINE CULTURE  PREGNANCY, URINE    Imaging Review No results found.    MDM   Final diagnoses:  UTI (lower urinary tract infection)   The patient does have a urinary tract infection, there is no other acute findings on her exam, her vital signs are totally normal, she is artery following with a rheumatologist who will follow-up for her rash and general symptoms. Culture sent, Keflex started, patient expresses understanding to the indications for return.  Meds given in ED:  Medications    cephALEXin (KEFLEX) capsule 500 mg (not administered)       I personally performed the services described in this documentation, which was scribed in my presence. The recorded information has been reviewed and is accurate.       Eber Hong, MD 11/18/14 2103

## 2014-11-20 LAB — HLA-B27 ANTIGEN: DNA Result:: NOT DETECTED

## 2014-11-22 LAB — URINE CULTURE
Culture: 70000
SPECIAL REQUESTS: NORMAL

## 2014-11-23 NOTE — Progress Notes (Signed)
ED Antimicrobial Stewardship Positive Culture Follow Up   Tina Ortiz is an 19 y.o. female who presented to [facility] on 11/18/2014 with a chief complaint of  Chief Complaint  Patient presents with  . Urinary Retention  . Abdominal Pain    Recent Results (from the past 720 hour(s))  Urine culture     Status: None   Collection Time: 11/18/14  6:52 PM  Result Value Ref Range Status   Specimen Description URINE, CLEAN CATCH  Final   Special Requests Normal  Final   Culture   Final    70,000 COLONIES/ml STAPHYLOCOCCUS SPECIES (COAGULASE NEGATIVE) Performed at [facility]    Report Status 11/22/2014 FINAL  Final   Organism ID, Bacteria STAPHYLOCOCCUS SPECIES (COAGULASE NEGATIVE)  Final      Susceptibility   Staphylococcus species (coagulase negative) - MIC*    CIPROFLOXACIN <=0.5 SENSITIVE Sensitive     GENTAMICIN 4 SENSITIVE Sensitive     NITROFURANTOIN 32 SENSITIVE Sensitive     OXACILLIN 1 RESISTANT Resistant     TETRACYCLINE 2 SENSITIVE Sensitive     VANCOMYCIN <=0.5 SENSITIVE Sensitive     TRIMETH/SULFA >=320 RESISTANT Resistant     CLINDAMYCIN <=0.25 RESISTANT Resistant     RIFAMPIN <=0.5 SENSITIVE Sensitive     Inducible Clindamycin POSITIVE Resistant     * 70,000 COLONIES/ml STAPHYLOCOCCUS SPECIES (COAGULASE NEGATIVE)   Patient with no urinary symptoms and many squamous cells in her UA.  No treatment is indicated.  ED Provider: Santiago Glad, PA  Tina Ortiz 11/23/2014, 8:47 AM Infectious Diseases Pharmacist Phone# [phone]

## 2014-11-25 ENCOUNTER — Telehealth: Payer: Self-pay | Admitting: *Deleted

## 2014-11-25 NOTE — ED Notes (Signed)
(+)  urine culture, treated with Cephalexin, OK per Santiago Glad, PA-C

## 2014-12-01 ENCOUNTER — Ambulatory Visit (INDEPENDENT_AMBULATORY_CARE_PROVIDER_SITE_OTHER): Payer: Commercial Indemnity | Admitting: Family Medicine

## 2014-12-01 ENCOUNTER — Encounter: Payer: Self-pay | Admitting: Family Medicine

## 2014-12-01 ENCOUNTER — Encounter: Payer: Self-pay | Admitting: *Deleted

## 2014-12-01 VITALS — BP 112/71 | HR 113 | Temp 98.0°F | Resp 16 | Ht 62.0 in | Wt 126.0 lb

## 2014-12-01 DIAGNOSIS — T50905A Adverse effect of unspecified drugs, medicaments and biological substances, initial encounter: Secondary | ICD-10-CM

## 2014-12-01 DIAGNOSIS — T887XXA Unspecified adverse effect of drug or medicament, initial encounter: Secondary | ICD-10-CM

## 2014-12-01 MED ORDER — PREDNISONE 20 MG PO TABS: ORAL_TABLET | ORAL | Status: DC

## 2014-12-01 NOTE — Patient Instructions (Addendum)
Buy OTC generic benadryl tabs and take one  tab every 6 hours as needed for your itchy rash.  YOU ARE ALLERGIC TO CEPHALEXIN (KEFLEX).

## 2014-12-01 NOTE — Progress Notes (Signed)
OFFICE VISIT  12/01/2014   CC:  Chief Complaint  Patient presents with  . Rash    follow up not improving, spreading   HPI:    Patient is a 19 y.o. Caucasian female who presents for worsening rash plus vomiting. Last 2-3 days feeling malaise, nausea, vomited this morning a few times, feels L anterior neck fullness and pain.  No fever. Tramadol seems to help HA and hand and neck aching that she has had chronically (see my 11/17/14 note: rheum lab w/u neg). Two days ago she noted her previous rash had cleared, then she abruptly broke out in itchy, splotchy red rash on gluteal surface and all extremities and face.  She finished 7d of cephalexin about 5 d/a.   (ADDITIONAL HISTORY: Pt seen at ED 11/18/14 for urinary complaints and was dx'd with UTI and rx'd cephalexin.  Urine clx result on 11/23/14 showed Coag neg staph, resistant to oxacillin and bactrim.  It is likely that this was a vaginal contaminant.  Pt apparently had no true UTI sx's at ED).   Past Medical History  Diagnosis Date  . Depression     Past Surgical History  Procedure Laterality Date  . Wisdom tooth extraction      Outpatient Prescriptions Prior to Visit  Medication Sig Dispense Refill  . fluticasone (CUTIVATE) 0.05 % cream Apply to affected areas bid prn 60 g 2  . levonorgestrel-ethinyl estradiol (SRONYX) 0.1-20 MG-MCG tablet Take 1 tablet by mouth daily. 1 Package 11  . traMADol-acetaminophen (ULTRACET) 37.5-325 MG per tablet Take 1-2 tablets by mouth every 6 (six) hours as needed. 30 tablet 1  . cephALEXin (KEFLEX) 500 MG capsule Take 1 capsule (500 mg total) by mouth 4 (four) times daily. (Patient not taking: Reported on 12/01/2014) 28 capsule 0   No facility-administered medications prior to visit.    No Known Allergies  ROS As per HPI  PE: Blood pressure 112/71, pulse 113, temperature 98 F (36.7 C), temperature source Oral, resp. rate 16, height  (1.575 m), weight 126 lb (57.153 kg), last menstrual  period 11/03/2014, SpO2 98 %. Gen: Alert, well appearing.  Patient is oriented to person, place, time, and situation. WUJ:WJXB: no injection, icteris, swelling, or exudate.  EOMI, PERRLA. Mouth: lips without lesion/swelling.  Oral mucosa pink and moist. Oropharynx without erythema, exudate, or swelling.  Neck - No masses or thyromegaly or limitation in range of motion CV: RRR, no m/r/g.   LUNGS: CTA bilat, nonlabored resps, good aeration in all lung fields. ABD: soft, NT/ND EXT: no clubbing, cyanosis, or edema.  SKIN: pink, blanchable, macular rash that covers much of extensor surfaces of both arms and legs, gluteal region, hands, tops of feet, and some on both cheeks and on chin.  Most of the rash is coalesced into large swaths, but there are some areas of small oval hive-like lesions.    LABS:  None today  IMPRESSION AND PLAN:  Drug hypersensitivity rash; rxn to cephalexin. Benadryl  q6h prn. If too sedating then she can switch to allegra  qd and add benadryl hs prn. Start prednisone 40 mg qd x 4d, then 20 mg qd x 4d, then 10 mg qd x 4d.  I reassured pt that this has nothing to do with her previous rash that we felt was possibly related to rheumatologic disorder.  She'll go to rheum MD next week for consult.  An After Visit Summary was printed and given to the patient.  FOLLOW UP: Return if symptoms  worsen or fail to improve.

## 2014-12-01 NOTE — Progress Notes (Signed)
Pre visit review using our clinic review tool, if applicable. No additional management support is needed unless otherwise documented below in the visit note. 

## 2014-12-04 ENCOUNTER — Telehealth: Payer: Self-pay | Admitting: Family Medicine

## 2014-12-04 DIAGNOSIS — R519 Headache, unspecified: Secondary | ICD-10-CM

## 2014-12-04 DIAGNOSIS — R339 Retention of urine, unspecified: Secondary | ICD-10-CM

## 2014-12-04 DIAGNOSIS — R51 Headache: Principal | ICD-10-CM

## 2014-12-04 NOTE — Telephone Encounter (Signed)
Called pt, discussed symptoms further. Occipital HA's + one long period (24h) of urinary retention recently. Symptoms concerning for chiari malformation. Will obtain MRI brain w/out contrast to further evaluate.  Pt expressed understanding and agreed with plan.

## 2014-12-14 ENCOUNTER — Encounter: Payer: Self-pay | Admitting: Family Medicine

## 2014-12-14 ENCOUNTER — Ambulatory Visit (INDEPENDENT_AMBULATORY_CARE_PROVIDER_SITE_OTHER): Payer: Commercial Indemnity | Admitting: Family Medicine

## 2014-12-14 VITALS — BP 109/70 | HR 75 | Temp 97.3°F | Resp 16 | Ht 62.0 in | Wt 130.0 lb

## 2014-12-14 DIAGNOSIS — Z87448 Personal history of other diseases of urinary system: Secondary | ICD-10-CM

## 2014-12-14 DIAGNOSIS — L270 Generalized skin eruption due to drugs and medicaments taken internally: Secondary | ICD-10-CM | POA: Diagnosis not present

## 2014-12-14 DIAGNOSIS — M542 Cervicalgia: Secondary | ICD-10-CM

## 2014-12-14 DIAGNOSIS — R51 Headache: Secondary | ICD-10-CM

## 2014-12-14 DIAGNOSIS — Z87898 Personal history of other specified conditions: Secondary | ICD-10-CM

## 2014-12-14 DIAGNOSIS — R519 Headache, unspecified: Secondary | ICD-10-CM

## 2014-12-14 DIAGNOSIS — T3795XA Adverse effect of unspecified systemic anti-infective and antiparasitic, initial encounter: Secondary | ICD-10-CM

## 2014-12-14 DIAGNOSIS — G8929 Other chronic pain: Secondary | ICD-10-CM

## 2014-12-14 MED ORDER — TRAMADOL-ACETAMINOPHEN 37.5-325 MG PO TABS: 1.0000 | ORAL_TABLET | Freq: Four times a day (QID) | ORAL | Status: DC | PRN

## 2014-12-14 NOTE — Progress Notes (Signed)
OFFICE VISIT  12/14/2014   CC:  Chief Complaint  Patient presents with  . Rash     HPI:    Patient is a 19 y.o. Caucasian female who presents for f/u recent drug hypersensitivity rash. She says the rash is completely gone.  Finished steroids 2 d/a.  She has no rash at all now.  Still having posterior midline neck pain chronically, with occipital HA's.  No radiculopathy sx's. No n/v/photophobia.  No temporal HA's.  Takes ultracet about once every 3 days or so.  Saw Dr. Nickola Major for her rheum-type complaints, has f/u 2 wks.  No dx given, no records yet.  She is set to get her MRI brain to r/o chiari malformation tomorrow.  No further probs with urinary retention. No urinary incont, no loss of bowel function/control.   Past Medical History  Diagnosis Date  . Depression     Past Surgical History  Procedure Laterality Date  . Wisdom tooth extraction      Outpatient Prescriptions Prior to Visit  Medication Sig Dispense Refill  . levonorgestrel-ethinyl estradiol (SRONYX) 0.1-20 MG-MCG tablet Take 1 tablet by mouth daily. 1 Package 11  . traMADol-acetaminophen (ULTRACET) 37.5-325 MG per tablet Take 1-2 tablets by mouth every 6 (six) hours as needed. 30 tablet 1  . fluticasone (CUTIVATE) 0.05 % cream Apply to affected areas bid prn (Patient not taking: Reported on 12/14/2014) 60 g 2  . predniSONE (DELTASONE) 20 MG tablet 2 tabs po qd x 4d, then 1 tab po qd x 4d, then 1/2 tab po qd x 4d (Patient not taking: Reported on 12/14/2014) 14 tablet 0   No facility-administered medications prior to visit.    Allergies  Allergen Reactions  . Cephalexin Hives    ROS As per HPI  PE: Blood pressure 109/70, pulse 75, temperature 97.3 F (36.3 C), temperature source Oral, resp. rate 16, height  (1.575 m), weight 130 lb (58.968 kg), last menstrual period 11/03/2014, SpO2 98 %. Gen: Alert, well appearing.  Patient is oriented to person, place, time, and situation. SKIN: no rash or  jaundice.  LABS:  none  IMPRESSION AND PLAN:  1) Drug hypersensitivity rash (cephalexin): resolved with steroids.  2) Question of rheum dz: seeing Dr. Nickola Major.  Blood w/u so far has been normal/reassuring.  3) Posterior midline neck pain, occipital HA's, with one day of inability to empty bladder:  No recurrence of urinary retention. Continue with plan of getting MRI brain tomorrow. May continue ultracet every few days like she is doing, RF given today. F/u neck pain and HA's--possibly get c-spine plain films at that time--in 6 wks.  An After Visit Summary was printed and given to the patient.  FOLLOW UP: Return in about 6 weeks (around 01/25/2015) for f/u neck pain and HAs.

## 2014-12-14 NOTE — Progress Notes (Signed)
Pre visit review using our clinic review tool, if applicable. No additional management support is needed unless otherwise documented below in the visit note. 

## 2014-12-15 ENCOUNTER — Ambulatory Visit
Admission: RE | Admit: 2014-12-15 | Discharge: 2014-12-15 | Disposition: A | Discharge status: Home / Self Care | Payer: Commercial Indemnity | Source: Ambulatory Visit | Attending: Family Medicine | Admitting: Family Medicine

## 2014-12-15 DIAGNOSIS — R519 Headache, unspecified: Secondary | ICD-10-CM

## 2014-12-15 DIAGNOSIS — R51 Headache: Principal | ICD-10-CM

## 2014-12-15 DIAGNOSIS — R339 Retention of urine, unspecified: Secondary | ICD-10-CM

## 2014-12-18 ENCOUNTER — Other Ambulatory Visit: Payer: Self-pay | Admitting: Family Medicine

## 2014-12-18 DIAGNOSIS — R519 Headache, unspecified: Secondary | ICD-10-CM

## 2014-12-18 DIAGNOSIS — M542 Cervicalgia: Secondary | ICD-10-CM

## 2014-12-18 DIAGNOSIS — R51 Headache: Secondary | ICD-10-CM

## 2015-01-04 ENCOUNTER — Ambulatory Visit (HOSPITAL_BASED_OUTPATIENT_CLINIC_OR_DEPARTMENT_OTHER)
Admission: RE | Admit: 2015-01-04 | Discharge: 2015-01-04 | Disposition: A | Discharge status: Home / Self Care | Payer: Commercial Indemnity | Source: Ambulatory Visit | Attending: Family Medicine | Admitting: Family Medicine

## 2015-01-04 DIAGNOSIS — M542 Cervicalgia: Secondary | ICD-10-CM | POA: Diagnosis not present

## 2015-01-04 DIAGNOSIS — R51 Headache: Secondary | ICD-10-CM | POA: Diagnosis not present

## 2015-01-04 DIAGNOSIS — R519 Headache, unspecified: Secondary | ICD-10-CM

## 2015-01-23 ENCOUNTER — Encounter: Payer: Self-pay | Admitting: Family Medicine

## 2015-01-24 ENCOUNTER — Encounter: Payer: Self-pay | Admitting: Family Medicine

## 2015-01-24 ENCOUNTER — Ambulatory Visit (INDEPENDENT_AMBULATORY_CARE_PROVIDER_SITE_OTHER): Payer: Commercial Indemnity | Admitting: Family Medicine

## 2015-01-24 VITALS — BP 108/69 | HR 84 | Temp 98.1°F | Resp 16 | Ht 62.0 in | Wt 129.0 lb

## 2015-01-24 DIAGNOSIS — M542 Cervicalgia: Secondary | ICD-10-CM

## 2015-01-24 NOTE — Progress Notes (Signed)
OFFICE NOTE  01/24/2015  CC:  Chief Complaint  Patient presents with  . Follow-up    neck pain    HPI: Patient is a 19 y.o. Caucasian female who is here for 6 week f/u posterior midline neck pain. She did not go to PT like we had planned after last visit b/c her rheum discovered some evidence of joint erosion in her wrist/hand and started her on meloxicam and told her to hold off on PT.   Taking meloxicam  once daily and this has been helping with her neck pain, wrist/hand pains significantly. See PMH below.  Has to use her ultracet much less: about once per week for neck pain and occipital HA.   Pertinent PMH:  Past Medical History  Diagnosis Date  . Depression   . 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 Nickola Major (Rheum).    MEDS:  Outpatient Prescriptions Prior to Visit  Medication Sig Dispense Refill  . levonorgestrel-ethinyl estradiol (SRONYX) 0.1-20 MG-MCG tablet Take 1 tablet by mouth daily. 1 Package 11  . traMADol-acetaminophen (ULTRACET) 37.5-325 MG per tablet Take 1-2 tablets by mouth every 6 (six) hours as needed. 30 tablet 1   No facility-administered medications prior to visit.    PE: Blood pressure 108/69, pulse 84, temperature 98.1 F (36.7 C), temperature source Oral, resp. rate 16, height  (1.575 m), weight 129 lb (58.514 kg), last menstrual period 01/17/2015, SpO2 97 %. Gen: Alert, well appearing.  Patient is oriented to person, place, time, and situation. Neck: ROM full and w/out pain or stiffness.  No neck or trap tenderness.   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 Continue daily meloxicam--no PT needed since she is doing so well now. Continue ultracet prn more severe pain--no new rx needed today.  She asked if I would be willing to RF her OCP in a couple months when  her RF's run out (last rx'd by Maximino Sarin, FNP--who is no longer at this practice), and I said that I would be glad to RF this for her when the time comes.  An After Visit Summary was printed and given to the patient.  FOLLOW UP: as needed.

## 2015-01-24 NOTE — Progress Notes (Signed)
Pre visit review using our clinic review tool, if applicable. No additional management support is needed unless otherwise documented below in the visit note. 

## 2015-03-13 ENCOUNTER — Telehealth: Payer: Self-pay | Admitting: Family Medicine

## 2015-03-13 DIAGNOSIS — Z309 Encounter for contraceptive management, unspecified: Secondary | ICD-10-CM

## 2015-03-13 MED ORDER — LEVONORGESTREL-ETHINYL ESTRAD 0.1-20 MG-MCG PO TABS: 1.0000 | ORAL_TABLET | Freq: Every day | ORAL | Status: DC

## 2015-03-13 NOTE — Telephone Encounter (Signed)
Pt needs a refill on her birth control pills. Please call them in.

## 2015-03-13 NOTE — Telephone Encounter (Signed)
RF request for BC LOV: 01/24/15 acute visit Next ov: None Last written: 05/03/14 1 w/ 11RF

## 2015-03-13 NOTE — Telephone Encounter (Signed)
Pt advised and voiced understanding.   

## 2015-03-24 ENCOUNTER — Ambulatory Visit (INDEPENDENT_AMBULATORY_CARE_PROVIDER_SITE_OTHER): Payer: Worker's Compensation | Admitting: Physician Assistant

## 2015-03-24 VITALS — BP 128/72 | HR 82 | Temp 98.7°F | Resp 16 | Ht 62.0 in | Wt 135.0 lb

## 2015-03-24 DIAGNOSIS — Z23 Encounter for immunization: Secondary | ICD-10-CM

## 2015-03-24 DIAGNOSIS — S61219A Laceration without foreign body of unspecified finger without damage to nail, initial encounter: Secondary | ICD-10-CM

## 2015-03-24 NOTE — Patient Instructions (Addendum)
WOUND CARE Please return in 7 days to have your stitches/staples removed or sooner if you have concerns. Marland Kitchen Keep area clean and dry with dressing in place for 24 hours. . After 24 hours, remove bandage and wash wound gently with mild soap and warm water. When skin is dry, reapply a new bandage. Once wound is no longer draining, may leave wound open to air. . Continue daily cleansing with soap and water until stitches/staples are removed. . Do not apply any ointments or creams to the wound while stitches/staples are in place, as this may cause delayed healing. . Notify the office if you experience any of the following signs of infection: Swelling, redness, pus drainage, streaking, fever >101.0 F . Notify the office if you experience excessive bleeding that does not stop after 15-20 minutes of constant, firm pressure.    Tdap Vaccine (Tetanus, Diphtheria and Pertussis): What You Need to Know 1. Why get vaccinated? Tetanus, diphtheria and pertussis are very serious diseases. Tdap vaccine can protect [place] from these diseases. And, Tdap vaccine given to pregnant women can protect newborn babies against pertussis. TETANUS (Lockjaw) is rare in the [place] today. It causes painful muscle tightening and stiffness, usually all over the body.  It can lead to tightening of muscles in the head and neck so you can't open your mouth, swallow, or sometimes even breathe. Tetanus kills about 1 out of 10 people who are infected even after receiving the best medical care. DIPHTHERIA is also rare in the [place] today. It can cause a thick coating to form in the back of the throat.  It can lead to breathing problems, heart failure, paralysis, and death. PERTUSSIS (Whooping Cough) causes severe coughing spells, which can cause difficulty breathing, vomiting and disturbed sleep.  It can also lead to weight loss, incontinence, and rib fractures. Up to 2 in 100 adolescents and 5 in 100 adults with  pertussis are hospitalized or have complications, which could include pneumonia or death. These diseases are caused by bacteria. Diphtheria and pertussis are spread from person to person through secretions from coughing or sneezing. Tetanus enters the body through cuts, scratches, or wounds. Before vaccines, as many as 200,000 cases of diphtheria, 200,000 cases of pertussis, and hundreds of cases of tetanus, were reported in the [place] each year. Since vaccination began, reports of cases for tetanus and diphtheria have dropped by about 99% and for pertussis by about 80%. 2. Tdap vaccine Tdap vaccine can protect adolescents and adults from tetanus, diphtheria, and pertussis. One dose of Tdap is routinely given at age 73 or 32. People who did not get Tdap at that age should get it as soon as possible. Tdap is especially important for healthcare professionals and anyone having close contact with a baby younger than 12 months. Pregnant women should get a dose of Tdap during every pregnancy, to protect the newborn from pertussis. Infants are most at risk for severe, life-threatening complications from pertussis. Another vaccine, called Td, protects against tetanus and diphtheria, but not pertussis. A Td booster should be given every 10 years. Tdap may be given as one of these boosters if you have never gotten Tdap before. Tdap may also be given after a severe cut or burn to prevent tetanus infection. Your doctor or the person giving you the vaccine can give you more information. Tdap may safely be given at the same time as other vaccines. 3. Some people should not get this vaccine  A person who has  ever had a life-threatening allergic reaction after a previous dose of any diphtheria, tetanus or pertussis containing vaccine, OR has a severe allergy to any part of this vaccine, should not get Tdap vaccine. Tell the person giving the vaccine about any severe allergies.  Anyone who had coma or long  repeated seizures within 7 days after a childhood dose of DTP or DTaP, or a previous dose of Tdap, should not get Tdap, unless a cause other than the vaccine was found. They can still get Td.  Talk to your doctor if you:  have seizures or another nervous system problem,  had severe pain or swelling after any vaccine containing diphtheria, tetanus or pertussis,  ever had a condition called Guillain-Barr Syndrome (GBS),  aren't feeling well on the day the shot is scheduled. 4. Risks With any medicine, including vaccines, there is a chance of side effects. These are usually mild and go away on their own. Serious reactions are also possible but are rare. Most people who get Tdap vaccine do not have any problems with it. Mild problems following Tdap (Did not interfere with activities)  Pain where the shot was given (about 3 in 4 adolescents or 2 in 3 adults)  Redness or swelling where the shot was given (about 1 person in 5)  Mild fever of at least 100.73F (up to about 1 in 25 adolescents or 1 in 100 adults)  Headache (about 3 or 4 people in 10)  Tiredness (about 1 person in 3 or 4)  Nausea, vomiting, diarrhea, stomach ache (up to 1 in 4 adolescents or 1 in 10 adults)  Chills, sore joints (about 1 person in 10)  Body aches (about 1 person in 3 or 4)  Rash, swollen glands (uncommon) Moderate problems following Tdap (Interfered with activities, but did not require medical attention)  Pain where the shot was given (up to 1 in 5 or 6)  Redness or swelling where the shot was given (up to about 1 in 16 adolescents or 1 in 12 adults)  Fever over 102F (about 1 in 100 adolescents or 1 in 250 adults)  Headache (about 1 in 7 adolescents or 1 in 10 adults)  Nausea, vomiting, diarrhea, stomach ache (up to 1 or 3 people in 100)  Swelling of the entire arm where the shot was given (up to about 1 in 500). Severe problems following Tdap (Unable to perform usual activities; required  medical attention)  Swelling, severe pain, bleeding and redness in the arm where the shot was given (rare). Problems that could happen after any vaccine:  People sometimes faint after a medical procedure, including vaccination. Sitting or lying down for about 15 minutes can help prevent fainting, and injuries caused by a fall. Tell your doctor if you feel dizzy, or have vision changes or ringing in the ears.  Some people get severe pain in the shoulder and have difficulty moving the arm where a shot was given. This happens very rarely.  Any medication can cause a severe allergic reaction. Such reactions from a vaccine are very rare, estimated at fewer than 1 in a million doses, and would happen within a few minutes to a few hours after the vaccination. As with any medicine, there is a very remote chance of a vaccine causing a serious injury or death. The safety of vaccines is always being monitored. For more information, visit: http://floyd.org/www.cdc.gov/vaccinesafety/ 5. What if there is a serious problem? What should I look for?  Look for anything that  concerns you, such as signs of a severe allergic reaction, very high fever, or unusual behavior.  Signs of a severe allergic reaction can include hives, swelling of the face and throat, difficulty breathing, a fast heartbeat, dizziness, and weakness. These would usually start a few minutes to a few hours after the vaccination. What should I do?  If you think it is a severe allergic reaction or other emergency that can't wait, call 9-1-1 or get the person to the nearest hospital. Otherwise, call your doctor.  Afterward, the reaction should be reported to the Vaccine Adverse Event Reporting System (VAERS). Your doctor might file this report, or you can do it yourself through the VAERS web site at www.vaers.LAgents.no, or by calling 1-3154426637. VAERS does not give medical advice.  6. The National Vaccine Injury Compensation Program The [employer] Vaccine  Injury Compensation Program (VICP) is a federal program that was created to compensate people who may have been injured by certain vaccines. Persons who believe they may have been injured by a vaccine can learn about the program and about filing a claim by calling 1-575-337-0162 or visiting the VICP website at SpiritualWord.at. There is a time limit to file a claim for compensation. 7. How can I learn more?  Ask your doctor. He or she can give you the vaccine package insert or suggest other sources of information.  Call your local or state health department.  Contact the Centers for Disease Control and Prevention (CDC):  Call 3040696526 (1-800-CDC-INFO) or  Visit CDC's website at PicCapture.uy CDC Tdap Vaccine VIS (06/21/13)   This information is not intended to replace advice given to you by your health care provider. Make sure you discuss any questions you have with your health care provider.   Document Released: 10/14/2011 Document Revised: 05/05/2014 Document Reviewed: 07/27/2013 Elsevier Interactive Patient Education [employer]

## 2015-03-24 NOTE — Progress Notes (Signed)
[facility] [address], [place] [address] [phone]  Date:  03/24/2015   Name:  Tina Ortiz   DOB:  07/12/1995   MRN:  [id number]  PCP:  Jeoffrey MassedMCGOWEN,PHILIP H, MD    Chief Complaint: Hand Pain   History of Present Illness:  This is a 19 y.o. female who is presenting with a laceration to left index finger that occurred earlier today at work while she was cutting with a razor. 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 She is having some tingling at the tip of her finger. She is able to move finger without problems. She is not up to date on tetanus.   Review of Systems:  Review of Systems See HPI  Medications reviewed.  Allergies  Allergen Reactions  . Cephalexin Hives    Medication list has been reviewed and updated.  Physical Examination:  Physical Exam  Constitutional: She is oriented to person, place, and time. She appears well-developed and well-nourished. No distress.  HENT:  Head: Normocephalic and atraumatic.  Right Ear: Hearing normal.  Left Ear: Hearing normal.  Nose: Nose normal.  Eyes: Conjunctivae and lids are normal. Right eye exhibits no discharge. Left eye exhibits no discharge. No scleral icterus.  Pulmonary/Chest: Effort normal. No respiratory distress.  Musculoskeletal: Normal range of motion.  Neurological: She is alert and oriented to person, place, and time.  Skin: Skin is warm and dry.  1 cm laceration on ventral surface left 2nd digit. Strength intact. Cap refill intact FROM. Sensation intact.   Psychiatric: She has a normal mood and affect. Her speech is normal and behavior is normal. Thought content normal.   BP 128/72 mmHg  Pulse 82  Temp(Src) 98.7 F (37.1 C) (Oral)  Resp 16  Ht 5\' 2"  (1.575 m)  Wt 135 lb (61.236 kg)  BMI 24.69 kg/m2  SpO2 98%  Procedure: Verbal consent obtained. Skin was anesthetized with 1 cc 1% lido without epi and cleaned with soap and water. Wound explored, no tendon  involvement. A 1 cm laceration located left 2nd digit was sutured with #2 horizontal 5.0 ethilon sutures. Wound was dressed and wound care discussed.  Assessment and Plan:  1. Laceration of finger of left hand, initial encounter Wound repaired. Wound care discussed. Return in 7 days for suture removal.  2. Need for Tdap vaccination - Tdap vaccine greater than or equal to 7yo IM   Roswell MinersNicole V. Dyke BrackettBush, PA-C, MHS [facility]  03/24/2015

## 2015-04-02 ENCOUNTER — Ambulatory Visit (INDEPENDENT_AMBULATORY_CARE_PROVIDER_SITE_OTHER): Payer: Worker's Compensation | Admitting: Physician Assistant

## 2015-04-02 VITALS — BP 118/78 | HR 82 | Temp 98.3°F | Resp 16 | Ht 62.0 in | Wt 135.0 lb

## 2015-04-02 DIAGNOSIS — R202 Paresthesia of skin: Secondary | ICD-10-CM

## 2015-04-02 DIAGNOSIS — S61219D Laceration without foreign body of unspecified finger without damage to nail, subsequent encounter: Secondary | ICD-10-CM

## 2015-04-02 NOTE — Progress Notes (Addendum)
[facility] [address], [place] [address] [phone]  Date:  04/02/2015   Name:  Tina Ortiz   DOB:  November 22, 1995   MRN:  [id number]  PCP:  Jeoffrey MassedMCGOWEN,PHILIP H, MD    Chief Complaint: Suture / Staple Removal   History of Present Illness:  This is a 19 y.o. female who is presenting for suture removal from a laceration on her left index finger that occurred on 03/24/15. She is not having pain, fever/chills, erythema or drainage. She is still having some numbness at the tip of her finger, which was present after last visit as well.  Review of Systems:  Review of Systems See hPI  Meds reviewed.  Allergies  Allergen Reactions  . Cephalexin Hives    Medication list has been reviewed and updated.  Physical Examination:  Physical Exam  Constitutional: She is oriented to person, place, and time. She appears well-developed and well-nourished. No distress.  HENT:  Head: Normocephalic and atraumatic.  Right Ear: Hearing normal.  Left Ear: Hearing normal.  Nose: Nose normal.  Eyes: Conjunctivae and lids are normal. Right eye exhibits no discharge. Left eye exhibits no discharge. No scleral icterus.  Pulmonary/Chest: Effort normal. No respiratory distress.  Musculoskeletal: Normal range of motion.  Neurological: She is alert and oriented to person, place, and time. No sensory deficit.  Skin: Skin is warm, dry and intact.  #2 sutures intact in healing laceration on left index finger. No erythema, swelling, drainage or pain. Sutures removed without problems.  Psychiatric: She has a normal mood and affect. Her speech is normal and behavior is normal. Thought content normal.   BP 118/78 mmHg  Pulse 82  Temp(Src) 98.3 F (36.8 C) (Oral)  Resp 16  Ht 5\' 2"  (1.575 m)  Wt 135 lb (61.236 kg)  BMI 24.69 kg/m2  SpO2 98%  Assessment and Plan:  1. Laceration of finger of left hand, subsequent encounter 2. Paresthesia Laceration healing well. Sutures removed.  She is complaining of some tingling at tip of finger, which has been present since injury. Sensation intact on exam. She likely injured nerve and will take several weeks for normal feeling to return.   Roswell MinersNicole V. Dyke BrackettBush, PA-C, MHS [facility]  04/02/2015

## 2015-05-16 ENCOUNTER — Encounter: Payer: Self-pay | Admitting: Family Medicine

## 2016-02-12 ENCOUNTER — Telehealth: Payer: Self-pay | Admitting: Family Medicine

## 2016-02-12 NOTE — Telephone Encounter (Signed)
Has been over year since patient was seen and missed appointment for pap smear. Please advise.

## 2016-02-12 NOTE — Telephone Encounter (Signed)
Needs o/v for pap/pelvic.

## 2016-02-12 NOTE — Telephone Encounter (Signed)
appt scheduled

## 2016-02-12 NOTE — Telephone Encounter (Signed)
Patient calling to report her rx for levonorgestrel-ethinyl estradiol (SRONYX) 0.1-20 MG-MCG tablet has expired.  She is inquiring if she needs an ov with pcp to get a new rx.  If not please send refill to  [id number] [place], [place] - [address] [phone] (Phone) [phone] (Fax)

## 2016-02-20 ENCOUNTER — Ambulatory Visit (INDEPENDENT_AMBULATORY_CARE_PROVIDER_SITE_OTHER): Payer: Managed Care, Other (non HMO) | Admitting: Family Medicine

## 2016-02-20 ENCOUNTER — Encounter: Payer: Self-pay | Admitting: Family Medicine

## 2016-02-20 VITALS — BP 113/77 | HR 83 | Temp 98.0°F | Resp 16 | Wt 144.1 lb

## 2016-02-20 DIAGNOSIS — Z3041 Encounter for surveillance of contraceptive pills: Secondary | ICD-10-CM

## 2016-02-20 DIAGNOSIS — Z124 Encounter for screening for malignant neoplasm of cervix: Secondary | ICD-10-CM

## 2016-02-20 MED ORDER — LEVONORGESTREL-ETHINYL ESTRAD 0.1-20 MG-MCG PO TABS: 1.0000 | ORAL_TABLET | Freq: Every day | ORAL | 11 refills | Status: DC

## 2016-02-20 NOTE — Progress Notes (Signed)
OFFICE VISIT  02/20/2016   CC:  Chief Complaint  Patient presents with  . Follow-up    refill medication/ referral to ob/gyn     HPI:    Patient is a 20 y.o. Caucasian female who presents for RF of OCPs, discuss first pap/pelvic. She requests a referral to a GYn for this pelvic/pap.  School going well, gets A's, applying to grad school for counseling.  Her LMP started 5 days ago and this was when she expected it to come. She is sexually active but does NOT think she is pregnant. She does not smoke.  No c/o leg pain or swelling or SOB/dyspnea or CP.    Past Medical History:  Diagnosis Date  . 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 Hawkes ([place]he[place]).  As of 04/2015 f/u with Dr. Hawkes, the impression is that she likely does not have an inflammatory arthritis.  F/u 22mKoreaWallis B16FMarylanKoreadSaint JoseApolinar [facility] (46moKoreaWallis B16MarylanKoreadNortApolinar Cli Surge26moKoreaWallis B16MarylanApolinar [facility]79moKoreaWallis B16MarylanKoreadLouisiana Extended CaApolinar [place] Vista95moKoreaWallis B16MontMarylanKoreadCarolina PinApolinar [facility] Surge43moKoreaWallis B16MarylanKoreadExcela HeApolinar [facility] Medic91moKoreaWallis B16West CoMarylanKoreadApolinar [facility] Cent47moKoreaWallis B16St. MarylanKoreadNaApolinar Physicians Surge35moKoreaWallis B16KeeleMarylanKoreadMidmicApolinar [facility] 11moKoreaWallis B16MarylanKoreadUniveApolinar [facility] C71moKoreaWallis B1MarylanKoreadPermiApolinar [facility] H79moKoreaWallis B16Bailey's PMarylanKoApolinar East Side Surge27moKoreaWallis B16MunseMarylanKorApolinar [facility] Care Surge65moKoreaWallis BMarylanKoreadApolinar [facility] S63moKoreaWallis B16SMarylanKoreadMayo Clinic Arizona Apolinar [facility] Heal72moKoreaWallis B16MuMarylanKoreApolinar [facility] Surge52moKoreaWallis B16AMarylanKApolinar [facility]40moKoreaWallis B16MesquiteMarylanKorApolinar St Joseph'S78moKoreaWallis B16MerritMarylanKoreadWoodApolinar [facility] A38moKoreaWallis B16South SeMarylanKoreadApolinar [facility] Yo77moKoreaWallis B16Pleasant MaApolinar Gastrointestinal Heal8moKoreaWallis B16PortMarylanKoreadEncompass Health RehabilitApolinar [facility]30moKoreaWallis B16GlenMarylanKoreadChristus Southeast [place] OApolinar [facility] Ophthal60moKoreaWallis B16GaMarylanKoreadLake Taylor Apolinar Allegan General8moKoreaWallis BMarylanApolinar The Surge74moKoreaWallis B16MarylanKoreadUw MediApolinar [facility]81moKoreaWallis B16Put-MarylanKoreadApolinar Aker Kasten E58moKoreaWallis B16MarylanKoApolinar [facility]67moKoreaWallis B16MarylanKoreadRamapo Apolinar [facility] Surgical CenCecile Hearingc Hospitalession     Past Surgical History:  Procedure Laterality Date  . WISDOM TOOTH EXTRACTION      Outpatient Medications Prior to Visit  Medication Sig Dispense Refill  . meloxicam (MOBIC) 15 MG tablet Take 1 tablet by mouth daily.    . levonorgestrel-ethinyl estradiol (SRONYX) 0.1-20 MG-MCG tablet Take 1 tablet by mouth daily. 1 Package 11  . traMADol-acetaminophen (ULTRACET) 37.5-325 MG per tablet Take 1-2 tablets by mouth every 6 (six) hours as needed. (Patient not taking: Reported on 02/20/2016) 30 tablet 1   No facility-administered medications prior to visit.     Allergies  Allergen Reactions  . Cephalexin Hives    ROS As per HPI  PE: Blood pressure 113/77, pulse 83, temperature 98 F (36.7 C), temperature source Temporal, resp. rate 16, weight 144 lb 1.9 oz (65.4 kg), last menstrual period 02/16/2016, SpO2 97 %. Gen: Alert, well appearing.  Patient is oriented to person, place, time, and situation. AFFECT: pleasant, lucid thought and speech. CV: RRR, no m/r/g.   LUNGS: CTA bilat, nonlabored resps, good  aeration in all lung fields. EXT: no clubbing, cyanosis, or edema.    LABS:  none  IMPRESSION AND PLAN:  Contraceptive counseling:  She is consistent with use of current OCP--this med was RF'd today x 1 yr.  No sign of complication from the pills. She is now at the recommended age to start getting cervical ca screening so I referred her to GYN for this at her request.    An After Visit Summary was printed and given to the patient.  FOLLOW UP: Return for as needed.  Signed:  Phil Dartanian Knaggs, MD           02/20/2016

## 2016-02-20 NOTE — Progress Notes (Signed)
Pre visit review using our clinic review tool, if applicable. No additional management support is needed unless otherwise documented below in the visit note. 

## 2016-04-22 ENCOUNTER — Encounter: Payer: Self-pay | Admitting: *Deleted

## 2016-11-30 ENCOUNTER — Other Ambulatory Visit: Payer: Self-pay | Admitting: Family Medicine

## 2016-12-01 NOTE — Telephone Encounter (Signed)
[facility] [place] 

## 2016-12-02 ENCOUNTER — Telehealth: Payer: Self-pay | Admitting: Family Medicine

## 2016-12-02 DIAGNOSIS — Z23 Encounter for immunization: Secondary | ICD-10-CM

## 2016-12-02 NOTE — Telephone Encounter (Signed)
Patient needs copy of immunization records, immunization records must have Dr Milinda CaveMcGowen signature / stamp along with address.   Patient's mom will come to office to pick up when ready.   Immunization record complete and signed by Dr Milinda CaveMcGowen.  Patient made aware

## 2017-02-16 ENCOUNTER — Other Ambulatory Visit: Payer: Self-pay | Admitting: *Deleted

## 2017-02-16 MED ORDER — LEVONORGESTREL-ETHINYL ESTRAD 0.1-20 MG-MCG PO TABS: 1.0000 | ORAL_TABLET | Freq: Every day | ORAL | 0 refills | Status: DC

## 2017-02-16 NOTE — Telephone Encounter (Signed)
Requesting 90 day supply.

## 2017-02-17 NOTE — Telephone Encounter (Signed)
MyChart message read.

## 2017-03-18 ENCOUNTER — Other Ambulatory Visit: Payer: Self-pay | Admitting: Family Medicine

## 2017-04-13 ENCOUNTER — Other Ambulatory Visit: Payer: Self-pay

## 2017-04-13 MED ORDER — LEVONORGESTREL-ETHINYL ESTRAD 0.1-20 MG-MCG PO TABS: 1.0000 | ORAL_TABLET | Freq: Every day | ORAL | 2 refills | Status: DC

## 2017-12-08 ENCOUNTER — Ambulatory Visit: Payer: Managed Care, Other (non HMO) | Admitting: Family Medicine

## 2017-12-08 ENCOUNTER — Encounter: Payer: Self-pay | Admitting: Family Medicine

## 2017-12-08 VITALS — BP 108/70 | HR 76 | Temp 98.6°F | Resp 16 | Ht 62.0 in | Wt 163.2 lb

## 2017-12-08 DIAGNOSIS — M25542 Pain in joints of left hand: Secondary | ICD-10-CM

## 2017-12-08 DIAGNOSIS — M25541 Pain in joints of right hand: Secondary | ICD-10-CM | POA: Diagnosis not present

## 2017-12-08 DIAGNOSIS — M542 Cervicalgia: Secondary | ICD-10-CM

## 2017-12-08 LAB — COMPREHENSIVE METABOLIC PANEL
ALT: 14 U/L (ref 0–35)
AST: 15 U/L (ref 0–37)
Albumin: 4.1 g/dL (ref 3.5–5.2)
Alkaline Phosphatase: 67 U/L (ref 39–117)
BUN: 9 mg/dL (ref 6–23)
CALCIUM: 9.6 mg/dL (ref 8.4–10.5)
CHLORIDE: 104 meq/L (ref 96–112)
CO2: 29 mEq/L (ref 19–32)
Creatinine, Ser: 1.02 mg/dL (ref 0.40–1.20)
GFR: 71.07 mL/min (ref >60.00)
Glucose, Bld: 97 mg/dL (ref 70–99)
POTASSIUM: 4.5 meq/L (ref 3.5–5.1)
Sodium: 137 mEq/L (ref 135–145)
Total Bilirubin: 0.5 mg/dL (ref 0.2–1.2)
Total Protein: 6.9 g/dL (ref 6.0–8.3)

## 2017-12-08 LAB — C-REACTIVE PROTEIN: CRP: 1.5 mg/dL (ref 0.5–20.0)

## 2017-12-08 LAB — SEDIMENTATION RATE: Sed Rate: 24 mm/hr — ABNORMAL HIGH (ref 0–20)

## 2017-12-08 MED ORDER — PREDNISONE 10 MG PO TABS: ORAL_TABLET | ORAL | 0 refills | Status: DC

## 2017-12-08 NOTE — Patient Instructions (Signed)
Stop ibuprofen/advil/motrin.  Take [phone] mg of tylenol every 6 hours as needed for pain.

## 2017-12-08 NOTE — Progress Notes (Signed)
OFFICE VISIT  12/08/2017   CC:  Chief Complaint  Patient presents with  . Arthritis    ? Neck/Back, fingers and wrists     HPI:    Patient is a 22 y.o. Caucasian female who presents for joint pain. (no f/u with Rheum since 2017--see PMH section of rheum details).   Her joint pain is gradually getting worse over the last year: ibup otc 2 tabs every 6 hours CHRONICALLY, and this is not helping very well anymore. Neck and mid-upper back--constant mild level of pain, gets worse when having to stand a lot or maintain posture a lot sitting down. Wrists and fingers: painful and stiff, has to crack them---these joints flare with pain a couple days a week.  Says the PIPs are TTP when they are hurting and stiff, otherwise fingers feel normal.  No MTP or DIP c/o's. Has to stretch for an hour prior to going to work, also struggles during day with stiffness/having to crack her neck frequently. No joint swelling is noted but she feels like her neck is warm and swollen, but cracking it relieves that feeling for a while. No joint redness.  No muscle aches.  No rash.  No weakness in arms or legs.  No tingling or numbness anywhere.  Occ sx's in hips/LE joints but "not enough to really mention". No elbows or shoulders pain.  No lower back pain.  No recent injury or strain. She is unable to exercise, partly due to pain and limitation with her stiffness. No recent change in her diet.  ROS: no CP, no SOB, no wheezing, no cough, no dizziness, no HAs, no rashes, no melena/hematochezia.  No polyuria or polydipsia.  No myalgias.  No oral ulcers, no fevers. Some GER lately b/c has to lie completely supine lately. Some constipation lately, which she attributes to taking ibup chronically.   Past Medical History:  Diagnosis Date  . 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 Trudie Reed (Rheum).  As of 04/2015 f/u with Dr. Trudie Reed, the impression is that  she likely does not have an inflammatory arthritis.  F/u 74mowith Dr. HTrudie Reed  Pt takes 400-600 ibup per day b/c this works better than meloxicam as of 01/2016.  .Marland KitchenDepression     Past Surgical History:  Procedure Laterality Date  . WISDOM TOOTH EXTRACTION     MEDS: NOT taking mobic or ultracet listed below Outpatient Medications Prior to Visit  Medication Sig Dispense Refill  . ibuprofen (ADVIL) 200 MG tablet Take 200 mg by mouth every 8 (eight) hours as needed.    .Marland Kitchenlevonorgestrel-ethinyl estradiol (SRONYX) 0.1-20 MG-MCG tablet Take 1 tablet by mouth daily. 84 tablet 2  . meloxicam (MOBIC) 15 MG tablet Take 1 tablet by mouth daily.    . traMADol-acetaminophen (ULTRACET) 37.5-325 MG per tablet Take 1-2 tablets by mouth every 6 (six) hours as needed. (Patient not taking: Reported on 02/20/2016) 30 tablet 1   No facility-administered medications prior to visit.     Allergies  Allergen Reactions  . Cephalexin Hives    ROS As per HPI  PE: Blood pressure 108/70, pulse 76, temperature 98.6 F (37 C), temperature source Oral, resp. rate 16, height 5' 2"  (1.575 m), weight 163 lb 4 oz (74 kg), SpO2 99 %. Body mass index is 29.86 kg/m.  Gen: Alert, well appearing.  Patient is oriented to person, place, time, and situation. AFFECT: pleasant, lucid thought and speech. EVWU:JWJX no injection, icteris, swelling, or  exudate.  EOMI, PERRLA. Mouth: lips without lesion/swelling.  Oral mucosa pink and moist. Oropharynx without erythema, exudate, or swelling.  Neck - No masses or thyromegaly or limitation in range of motion CV: RRR, no m/r/g.   LUNGS: CTA bilat, nonlabored resps, good aeration in all lung fields. EXT: no clubbing, cyanosis, or edema.  Musculoskeletal: no joint swelling, erythema, warmth, or tenderness.  ROM of all joints intact. Her neck has an audible pop when she does full rotation ROM, but no pain with this today.   LABS:  Lab Results  Component Value Date   ESRSEDRATE  28 (H) 11/17/2014     Chemistry      Component Value Date/Time   NA 136 11/17/2014 1029   K 4.0 11/17/2014 1029   CL 103 11/17/2014 1029   CO2 25 11/17/2014 1029   BUN 9 11/17/2014 1029   CREATININE 0.86 11/17/2014 1029      Component Value Date/Time   CALCIUM 9.1 11/17/2014 1029   ALKPHOS 67 11/17/2014 1029   AST 14 11/17/2014 1029   ALT 10 11/17/2014 1029   BILITOT 0.4 11/17/2014 1029     Lab Results  Component Value Date   WBC 5.1 11/17/2014   HGB 12.4 11/17/2014   HCT 37.7 11/17/2014   MCV 79.3 11/17/2014   PLT 355.0 11/17/2014   Lab Results  Component Value Date   CRP 2.3 11/17/2014   ANA neg 11/17/14 HLA B27 neg 11/17/14 RMSF and Lyme testing NEG 02/03/14 HIV and RPR NR 01/23/14  IMPRESSION AND PLAN:  Polyarthralgia: specifically C spine, both wrists, and PIPs of both hands. Rheum eval was done back in 2016 and pt was told she did not have inflammatory arthritis.   She is worsening gradually, but still w/out any red flags for inflammatory arthritis. Plan: will do diagnostic and therapeutic trial of prednisone: 42m qd x 5d, then 222mqd x 5d, then 1072md x 5d. Check plain films of C spine and left hand. CMET, CBC, CRP, ESR today. Stop ibup.  Take tylenol [phone] mg q6h prn for pain.   An After Visit Summary was printed and given to the patient.  FOLLOW UP: Return in about 2 weeks (around 12/22/2017) for f/u joint pain/pred trial.  Signed:  PhiCrissie SicklesD           12/08/2017

## 2017-12-09 ENCOUNTER — Ambulatory Visit (HOSPITAL_BASED_OUTPATIENT_CLINIC_OR_DEPARTMENT_OTHER)
Admission: RE | Admit: 2017-12-09 | Discharge: 2017-12-09 | Disposition: A | Discharge status: Home / Self Care | Payer: Managed Care, Other (non HMO) | Source: Ambulatory Visit | Attending: Family Medicine | Admitting: Family Medicine

## 2017-12-09 ENCOUNTER — Encounter: Payer: Self-pay | Admitting: *Deleted

## 2017-12-09 DIAGNOSIS — M25542 Pain in joints of left hand: Secondary | ICD-10-CM | POA: Insufficient documentation

## 2017-12-09 DIAGNOSIS — M542 Cervicalgia: Secondary | ICD-10-CM | POA: Insufficient documentation

## 2017-12-09 DIAGNOSIS — M25541 Pain in joints of right hand: Secondary | ICD-10-CM | POA: Insufficient documentation

## 2017-12-13 ENCOUNTER — Other Ambulatory Visit: Payer: Self-pay | Admitting: Family Medicine

## 2017-12-14 NOTE — Telephone Encounter (Signed)
RF request for sronyx LOV: 02/20/16 Next ov: None Last written: 04/13/17 #84 w/ 2RF  Please advise. Thanks.

## 2017-12-25 ENCOUNTER — Encounter: Payer: Self-pay | Admitting: Family Medicine

## 2017-12-25 ENCOUNTER — Ambulatory Visit: Payer: Managed Care, Other (non HMO) | Admitting: Family Medicine

## 2017-12-25 VITALS — BP 122/82 | HR 96 | Resp 16 | Ht 62.0 in | Wt 169.0 lb

## 2017-12-25 DIAGNOSIS — M25532 Pain in left wrist: Secondary | ICD-10-CM

## 2017-12-25 DIAGNOSIS — M25531 Pain in right wrist: Secondary | ICD-10-CM

## 2017-12-25 DIAGNOSIS — M25541 Pain in joints of right hand: Secondary | ICD-10-CM

## 2017-12-25 DIAGNOSIS — M542 Cervicalgia: Secondary | ICD-10-CM | POA: Diagnosis not present

## 2017-12-25 DIAGNOSIS — M25542 Pain in joints of left hand: Secondary | ICD-10-CM | POA: Diagnosis not present

## 2017-12-25 MED ORDER — TRAMADOL-ACETAMINOPHEN 37.5-325 MG PO TABS: ORAL_TABLET | ORAL | 0 refills | Status: DC

## 2017-12-25 NOTE — Progress Notes (Signed)
OFFICE VISIT  12/28/2017   CC:  Chief Complaint  Patient presents with  . Follow-up    joint pain    HPI:    Patient is a 22 y.o. Caucasian female who presents for 2 wk f/u polyarthralgia: specifically C spine, both wrists, and PIPs of both hands. Longstanding problem, has been progressing.   L hand and C spine plain films were normal.  CMET, ESR, CRP all normal. Last visit we decided to do a 15 day prednisone taper to see if this would bring significant relief.  Pt did not get any appreciable improvement in sx's from prednisone. She has never had any signif response to NSAIDs in the past either and was escalating doses of ibup in recent past in order to try to get some comfort. She is interested in rheum referral---this would be for a second opinion b/c she has seen Dr. Trudie Reed in the past.  No new symptoms.   Past Medical History:  Diagnosis Date  . 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 Trudie Reed (Rheum).  As of 04/2015 f/u with Dr. Trudie Reed, the impression is that she likely does not have an inflammatory arthritis.  F/u 73mowith Dr. HTrudie Reed  Pt takes 400-600 ibup per day b/c this works better than meloxicam as of 01/2016.  .Marland KitchenDepression     Past Surgical History:  Procedure Laterality Date  . WISDOM TOOTH EXTRACTION      Outpatient Medications Prior to Visit  Medication Sig Dispense Refill  . SRONYX 0.1-20 MG-MCG tablet TAKE 1 TABLET BY MOUTH EVERY DAY 84 tablet 4  . ibuprofen (ADVIL) 200 MG tablet Take 200 mg by mouth every 8 (eight) hours as needed.    . meloxicam (MOBIC) 15 MG tablet Take 1 tablet by mouth daily.    . predniSONE (DELTASONE) 10 MG tablet 4 tabs po qd x 5d, then 2 tabs po qd x 5d, then 1 tab po qd x 5d (Patient not taking: Reported on 12/25/2017) 35 tablet 0  . traMADol-acetaminophen (ULTRACET) 37.5-325 MG per tablet Take 1-2 tablets by mouth every 6 (six) hours as needed. (Patient not taking:  Reported on 02/20/2016) 30 tablet 1   No facility-administered medications prior to visit.     Allergies  Allergen Reactions  . Cephalexin Hives    ROS As per HPI  PE: Blood pressure 122/82, pulse 96, resp. rate 16, height 5' 2"  (1.575 m), weight 169 lb (76.7 kg), SpO2 98 %. Gen: Alert, well appearing.  Patient is oriented to person, place, time, and situation. AFFECT: pleasant, lucid thought and speech. No further exam today.  LABS:  Lab Results  Component Value Date   TSH 1.87 01/23/2014   Lab Results  Component Value Date   WBC 5.1 11/17/2014   HGB 12.4 11/17/2014   HCT 37.7 11/17/2014   MCV 79.3 11/17/2014   PLT 355.0 11/17/2014   Lab Results  Component Value Date   CREATININE 1.02 12/08/2017   BUN 9 12/08/2017   NA 137 12/08/2017   K 4.5 12/08/2017   CL 104 12/08/2017   CO2 29 12/08/2017   Lab Results  Component Value Date   ALT 14 12/08/2017   AST 15 12/08/2017   ALKPHOS 67 12/08/2017   BILITOT 0.5 12/08/2017   Erythrocyte Sedimentation Rate     Component Value Date/Time   ESRSEDRATE 24 (H) 12/08/2017 1006   Lab Results  Component Value Date   CRP 1.5 12/08/2017  IMPRESSION AND PLAN:  Chronic cervicalgia+ arthralgias of both wrists and both hands. No improvement with trial of prednisone taper. Unrevealing rheum w/u in 2016/17. Exam (and plain films) not suggestive of inflammatory etiology, but pt is in significant discomfort and is interested in restart of ultracet that she had some mild symptomatic improvement on in the past AND desires referral to different rheum MD for 2nd opinion. Referral ordered today. Ultracet 50/325, 1-2 bid prn, #120, no Rf.  She is aware that if I manage her pain with this med on a chronic basis then she will need to be followed by me in office q3 mo and sign controlled substance contract.  An After Visit Summary was printed and given to the patient.  FOLLOW UP: Return in about 2 months (around 02/24/2018) for f/u  polyarthralgia/chronic pain.  Signed:  Crissie Sickles, MD           12/28/2017

## 2018-01-18 NOTE — Progress Notes (Signed)
Office Visit Note  Patient: Tina Ortiz             Date of Birth: October 10, 1995           MRN: [id number]             PCP: Jeoffrey MassedMcGowen, Philip H, MD Referring: Jeoffrey MassedMcGowen, Philip H, MD Visit Date: 01/29/2018 Occupation: Graduate assistant at [employer]  Subjective:  Pain in multiple joints  History of Present Illness: Tina Ortiz is a 22 y.o. female seen in consultation per request of her PCP.  According to patient her symptoms started in the fall 2015.  She states she was experiencing pain in her neck, upper back, wrist joints, both hands and both ankles.  She started noticing some popping and cracking sensation in her cervical spine.  She recalls having a stiffness in her joints but no joint swelling.  She started taking Advil and eventually stopped it.  She also developed an episode of locked jaw which she eventually resolved.  In 2016 she developed a rash all over her body at the time she was seen by her PCP and had some lab work and urinalysis which was normal.  The following day she was seen at the emergency room where she was diagnosed with urinary tract infection and was given Keflex.  She noticed worsening of her rash.  She was followed up by her PCP who felt that the rash was due to allergic reaction.  She was given prednisone and the rash resolved.  She continues to have arthralgias in multiple joints as described above.  In 2016 she was seen by Dr. Lendon ColonelHawks who did lab work according to patient that was all negative.  She also had ultrasound of her hands and was diagnosed with osteoarthritis.  Activities of Daily Living:  Patient reports morning stiffness for 30 minutes.   Patient Reports nocturnal pain.  Difficulty dressing/grooming: Denies Difficulty climbing stairs: Denies Difficulty getting out of chair: Denies Difficulty using hands for taps, buttons, cutlery, and/or writing: Denies  Review of Systems  Constitutional: Positive for fatigue. Negative for night sweats, weight gain and weight  loss.  HENT: Negative for mouth sores, trouble swallowing, trouble swallowing, mouth dryness and nose dryness.   Eyes: Negative for pain, redness, visual disturbance and dryness.  Respiratory: Negative for cough, shortness of breath and difficulty breathing.   Cardiovascular: Negative for chest pain, palpitations, hypertension, irregular heartbeat and swelling in legs/feet.  Gastrointestinal: Positive for constipation. Negative for blood in stool and diarrhea.  Endocrine: Positive for increased urination.  Genitourinary: Negative for difficulty urinating and vaginal dryness.  Musculoskeletal: Positive for arthralgias, joint pain and morning stiffness. Negative for joint swelling, myalgias, muscle weakness, muscle tenderness and myalgias.  Skin: Negative for color change, rash, hair loss, skin tightness, ulcers and sensitivity to sunlight.  Allergic/Immunologic: Negative for susceptible to infections.  Neurological: Negative for dizziness, memory loss, night sweats and weakness.  Hematological: Negative for bruising/bleeding tendency and swollen glands.  Psychiatric/Behavioral: Positive for sleep disturbance. Negative for depressed mood. The patient is not nervous/anxious.     PMFS History:  Patient Active Problem List   Diagnosis Date Noted  . Birth control 05/03/2014  . Cephalalgia 02/15/2014  . Vitamin D deficiency 02/03/2014  . Hematuria, microscopic 02/03/2014  . Neck pain, musculoskeletal 02/03/2014    Past Medical History:  Diagnosis Date  . 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 Nickola MajorHawkes (Rheum).  As of 04/2015 f/u with Dr. Nickola MajorHawkes, the  impression is that she likely does not have an inflammatory arthritis.  F/u 46mo with Dr. Nickola Major.  Pt takes 400-600 ibup per day b/c this works better than meloxicam as of 01/2016.  Marland Kitchen Depression     Family History  Problem Relation Age of Onset  . Thyroid disease Mother   . Lupus  Father   . Hypertension Father   . Arthritis Maternal Grandmother        osteo  . Diabetes Maternal Grandmother   . Alcohol abuse Maternal Grandfather   . Lupus Paternal Grandmother   . Hyperlipidemia Paternal Grandmother    Past Surgical History:  Procedure Laterality Date  . WISDOM TOOTH EXTRACTION     Social History   Social History Narrative  . Not on file    Objective: Vital Signs: BP 111/75 (BP Location: Right Arm, Patient Position: Sitting, Cuff Size: Normal)   Pulse 68   Resp 14   Ht 5\' 2"  (1.575 m)   Wt 164 lb (74.4 kg)   LMP 01/22/2018   BMI 30.00 kg/m    Physical Exam  Constitutional: She is oriented to person, place, and time. She appears well-developed and well-nourished.  HENT:  Head: Normocephalic and atraumatic.  Eyes: Conjunctivae and EOM are normal.  Neck: Normal range of motion.  Cardiovascular: Normal rate, regular rhythm, normal heart sounds and intact distal pulses.  Pulmonary/Chest: Effort normal and breath sounds normal.  Abdominal: Soft. Bowel sounds are normal.  Lymphadenopathy:    She has no cervical adenopathy.  Neurological: She is alert and oriented to person, place, and time.  Skin: Skin is warm and dry. Capillary refill takes less than 2 seconds.  Psychiatric: She has a normal mood and affect. Her behavior is normal.  Nursing note and vitals reviewed.    Musculoskeletal Exam: C-spine, thoracic and lumbar spine good range of motion.  Shoulder joints, elbow joints, wrist joints, MCPs, PIPs and DIPs were in good range of motion with no synovitis.  She has hyperextension of bilateral elbows.  She also has hypermobility in her MCPs.  Hip joints were in good range of motion.  She has hyperextension of bilateral knee joints.  She has increased mobility in her bilateral ankle joints.  She has bilateral pes planus.  CDAI Exam: CDAI Score: Not documented Patient Global Assessment: Not documented; Provider Global Assessment: Not  documented Swollen: Not documented; Tender: Not documented Joint Exam   Not documented   There is currently no information documented on the homunculus. Go to the Rheumatology activity and complete the homunculus joint exam.  Investigation: No additional findings. Component     Latest Ref Rng & Units 11/17/2014  dsDNA Ab     0 - 9 IU/mL <1  ENA RNP Ab     0.0 - 0.9 AI <0.2  ENA SM Ab Ser-aCnc     0.0 - 0.9 AI <0.2  Scleroderma SCL-70     0.0 - 0.9 AI <0.2  ENA SSA (RO) Ab     0.0 - 0.9 AI <0.2  ENA SSB (LA) Ab     0.0 - 0.9 AI <0.2  Chromatin Ab SerPl-aCnc     0.0 - 0.9 AI <0.2  Anti JO-1     0.0 - 0.9 AI <0.2  CENTROMERE AB SCREEN     0.0 - 0.9 AI <0.2  SEE BELOW      Comment  Sed Rate     0 - 22 mm/hr 28 (H)  CRP  0.5 - 20.0 mg/dL 2.3  DNA Result:      Not Detected   Component     Latest Ref Rng & Units 12/08/2017  Sed Rate     0 - 20 mm/hr 24 (H)  CRP     0.5 - 20.0 mg/dL 1.5   Imaging: No results found.  Recent Labs: Lab Results  Component Value Date   WBC 5.1 11/17/2014   HGB 12.4 11/17/2014   PLT 355.0 11/17/2014   NA 137 12/08/2017   K 4.5 12/08/2017   CL 104 12/08/2017   CO2 29 12/08/2017   GLUCOSE 97 12/08/2017   BUN 9 12/08/2017   CREATININE 1.02 12/08/2017   BILITOT 0.5 12/08/2017   ALKPHOS 67 12/08/2017   AST 15 12/08/2017   ALT 14 12/08/2017   PROT 6.9 12/08/2017   ALBUMIN 4.1 12/08/2017   CALCIUM 9.6 12/08/2017    Speciality Comments: No specialty comments available.  Procedures:  No procedures performed Allergies: Cephalexin   Assessment / Plan:     Visit Diagnoses: Polyarthralgia-patient complains of pain in multiple joints for many years.  She has had autoimmune work-up several times which is been negative.  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  I did not see any synovitis on examination.  Detailed counseling was provided.  Isometric exercises were  discussed at length.  A list of natural anti-inflammatories was given.  Cervicalgia-she complains of neck discomfort.  She has good range of motion of her cervical spine.  I have given her some C-spine exercises.  Chronic midline thoracic back pain-she complains of thoracic pain.  I offered physical therapy which she declined.  Have given her a handout on back exercises.  Maintaining proper posture was discussed.  Pes planus of both feet-she has severe pes planus with subluxation of her ankles.  Proper fitting shoes with arch support were discussed.  Have also advised her to see a podiatrist to get custom orthotics.  Pain in both hands pain -I do not see any synovitis on examination.  Insomnia-she complains of insomnia due to neck is stiffness and discomfort.  Good sleep hygiene was discussed.  Vitamin D deficiency   Orders: No orders of the defined types were placed in this encounter.  No orders of the defined types were placed in this encounter.   Face-to-face time spent with patient was 50 minutes. Greater than 50% of time was spent in counseling and coordination of care.  Follow-Up Instructions: Return if symptoms worsen or fail to improve.   Pollyann Savoy, MD  Note - This record has been created using Animal nutritionist.  Chart creation errors have been sought, but may not always  have been located. Such creation errors do not reflect on  the standard of medical care.

## 2018-01-29 ENCOUNTER — Encounter: Payer: Self-pay | Admitting: Rheumatology

## 2018-01-29 ENCOUNTER — Ambulatory Visit: Payer: Managed Care, Other (non HMO) | Admitting: Rheumatology

## 2018-01-29 VITALS — BP 111/75 | HR 68 | Resp 14 | Ht 62.0 in | Wt 164.0 lb

## 2018-01-29 DIAGNOSIS — M2550 Pain in unspecified joint: Secondary | ICD-10-CM | POA: Diagnosis not present

## 2018-01-29 DIAGNOSIS — G4701 Insomnia due to medical condition: Secondary | ICD-10-CM

## 2018-01-29 DIAGNOSIS — G8929 Other chronic pain: Secondary | ICD-10-CM

## 2018-01-29 DIAGNOSIS — M542 Cervicalgia: Secondary | ICD-10-CM

## 2018-01-29 DIAGNOSIS — M79641 Pain in right hand: Secondary | ICD-10-CM

## 2018-01-29 DIAGNOSIS — M2141 Flat foot [pes planus] (acquired), right foot: Secondary | ICD-10-CM

## 2018-01-29 DIAGNOSIS — M546 Pain in thoracic spine: Secondary | ICD-10-CM

## 2018-01-29 DIAGNOSIS — M79642 Pain in left hand: Secondary | ICD-10-CM

## 2018-01-29 DIAGNOSIS — E559 Vitamin D deficiency, unspecified: Secondary | ICD-10-CM

## 2018-01-29 DIAGNOSIS — M2142 Flat foot [pes planus] (acquired), left foot: Secondary | ICD-10-CM

## 2018-01-29 NOTE — Patient Instructions (Signed)
Supplements for Osteoarthritis Natural anti-inflammatories can help reduce inflammation and joint stiffness without some of the harmful side effects non-steroidal anti-inflammatories (Advil, Motrin, Aleve, etc.)  Tumeric . Recommended dose 400 mg to 600 mg once daily (can cause stomach upset, may increase to three times daily as tolerated) . Do not take if you are on a blood thinner and stop prior to surgery  Ginger (root or capsules) . Recommended dose 2 grams in three divided doses or 4 cups of tea daily . Do not take if you are on a blood thinner and stop prior to surgery  Fish oil or Omega 3 . Two 3 ounce servings of fish a week or flaxseed, chia seeds, walnuts and almonds . Capsules: 2-3 grams twice daily; make sure it contains at least 30% of EPA/DHA  Tart Cherry (dried or extract or tablets) . Recommended dose 500 mg twice daily  You may be able to find some of these products at your local pharmacy but may also purchase at [employer], [employer], other specialty stores or online.  *Although these are natural products they can still interact with medications.  Always consult with your doctor or pharmacist when starting new supplements and medications*  Patient should be under the care of a physician while taking these supplements. This may not be reproduced without the permission of Dr. Pollyann Savoy.  Cervical Strain and Sprain Rehab Ask your health care provider which exercises are safe for you. Do exercises exactly as told by your health care provider and adjust them as directed. It is normal to feel mild stretching, pulling, tightness, or discomfort as you do these exercises, but you should stop right away if you feel sudden pain or your pain gets worse.Do not begin these exercises until told by your health care provider. Stretching and range of motion exercises These exercises warm up your muscles and joints and improve the movement and flexibility of your neck. These  exercises also help to relieve pain, numbness, and tingling. Exercise A: Cervical side bend  1. Using good posture, sit on a stable chair or stand up. 2. Without moving your shoulders, slowly tilt your left / right ear to your shoulder until you feel a stretch in your neck muscles. You should be looking straight ahead. 3. Hold for __________ seconds. 4. Repeat with the other side of your neck. Repeat __________ times. Complete this exercise __________ times a day. Exercise B: Cervical rotation  1. Using good posture, sit on a stable chair or stand up. 2. Slowly turn your head to the side as if you are looking over your left / right shoulder. ? Keep your eyes level with the ground. ? Stop when you feel a stretch along the side and the back of your neck. 3. Hold for __________ seconds. 4. Repeat this by turning to your other side. Repeat __________ times. Complete this exercise __________ times a day. Exercise C: Thoracic extension and pectoral stretch 1. Roll a towel or a small blanket so it is about 4 inches (10 cm) in diameter. 2. Lie down on your back on a firm surface. 3. Put the towel lengthwise, under your spine in the middle of your back. It should not be not under your shoulder blades. The towel should line up with your spine from your middle back to your lower back. 4. Put your hands behind your head and let your elbows fall out to your sides. 5. Hold for __________ seconds. Repeat __________ times. Complete this exercise __________  times a day. Strengthening exercises These exercises build strength and endurance in your neck. Endurance is the ability to use your muscles for a long time, even after your muscles get tired. Exercise D: Upper cervical flexion, isometric 1. Lie on your back with a thin pillow behind your head and a small rolled-up towel under your neck. 2. Gently tuck your chin toward your chest and nod your head down to look toward your feet. Do not lift your head  off the pillow. 3. Hold for __________ seconds. 4. Release the tension slowly. Relax your neck muscles completely before you repeat this exercise. Repeat __________ times. Complete this exercise __________ times a day. Exercise E: Cervical extension, isometric  1. Stand about 6 inches (15 cm) away from a wall, with your back facing the wall. 2. Place a soft object, about 6-8 inches (15-20 cm) in diameter, between the back of your head and the wall. A soft object could be a small pillow, a ball, or a folded towel. 3. Gently tilt your head back and press into the soft object. Keep your jaw and forehead relaxed. 4. Hold for __________ seconds. 5. Release the tension slowly. Relax your neck muscles completely before you repeat this exercise. Repeat __________ times. Complete this exercise __________ times a day. Posture and body mechanics  Body mechanics refers to the movements and positions of your body while you do your daily activities. Posture is part of body mechanics. Good posture and healthy body mechanics can help to relieve stress in your body's tissues and joints. Good posture means that your spine is in its natural S-curve position (your spine is neutral), your shoulders are pulled back slightly, and your head is not tipped forward. The following are general guidelines for applying improved posture and body mechanics to your everyday activities. Standing  When standing, keep your spine neutral and keep your feet about hip-width apart. Keep a slight bend in your knees. Your ears, shoulders, and hips should line up.  When you do a task in which you stand in one place for a long time, place one foot up on a stable object that is 2-4 inches (5-10 cm) high, such as a footstool. This helps keep your spine neutral. Sitting   When sitting, keep your spine neutral and your keep feet flat on the floor. Use a footrest, if necessary, and keep your thighs parallel to the floor. Avoid rounding your  shoulders, and avoid tilting your head forward.  When working at a desk or a computer, keep your desk at a height where your hands are slightly lower than your elbows. Slide your chair under your desk so you are close enough to maintain good posture.  When working at a computer, place your monitor at a height where you are looking straight ahead and you do not have to tilt your head forward or downward to look at the screen. Resting When lying down and resting, avoid positions that are most painful for you. Try to support your neck in a neutral position. You can use a contour pillow or a small rolled-up towel. Your pillow should support your neck but not push on it. This information is not intended to replace advice given to you by your health care provider. Make sure you discuss any questions you have with your health care provider. Document Released: 04/14/2005 Document Revised: 12/20/2015 Document Reviewed: 03/21/2015 Elsevier Interactive Patient Education  2018 [employer] Inc[employer]  Back Exercises The following exercises strengthen the muscles that help  to support the back. They also help to keep the lower back flexible. Doing these exercises can help to prevent back pain or lessen existing pain. If you have back pain or discomfort, try doing these exercises 2-3 times each day or as told by your health care provider. When the pain goes away, do them once each day, but increase the number of times that you repeat the steps for each exercise (do more repetitions). If you do not have back pain or discomfort, do these exercises once each day or as told by your health care provider. Exercises Single Knee to Chest  Repeat these steps 3-5 times for each leg: 1. Lie on your back on a firm bed or the floor with your legs extended. 2. Bring one knee to your chest. Your other leg should stay extended and in contact with the floor. 3. Hold your knee in place by grabbing your knee or thigh. 4. Pull on your  knee until you feel a gentle stretch in your lower back. 5. Hold the stretch for 10-30 seconds. 6. Slowly release and straighten your leg.  Pelvic Tilt  Repeat these steps 5-10 times: 1. Lie on your back on a firm bed or the floor with your legs extended. 2. Bend your knees so they are pointing toward the ceiling and your feet are flat on the floor. 3. Tighten your lower abdominal muscles to press your lower back against the floor. This motion will tilt your pelvis so your tailbone points up toward the ceiling instead of pointing to your feet or the floor. 4. With gentle tension and even breathing, hold this position for 5-10 seconds.  Cat-Cow  Repeat these steps until your lower back becomes more flexible: 1. Get into a hands-and-knees position on a firm surface. Keep your hands under your shoulders, and keep your knees under your hips. You may place padding under your knees for comfort. 2. Let your head hang down, and point your tailbone toward the floor so your lower back becomes rounded like the back of a cat. 3. Hold this position for 5 seconds. 4. Slowly lift your head and point your tailbone up toward the ceiling so your back forms a sagging arch like the back of a cow. 5. Hold this position for 5 seconds.  Press-Ups  Repeat these steps 5-10 times: 1. Lie on your abdomen (face-down) on the floor. 2. Place your palms near your head, about shoulder-width apart. 3. While you keep your back as relaxed as possible and keep your hips on the floor, slowly straighten your arms to raise the top half of your body and lift your shoulders. Do not use your back muscles to raise your upper torso. You may adjust the placement of your hands to make yourself more comfortable. 4. Hold this position for 5 seconds while you keep your back relaxed. 5. Slowly return to lying flat on the floor.  Bridges  Repeat these steps 10 times: 1. Lie on your back on a firm surface. 2. Bend your knees so they  are pointing toward the ceiling and your feet are flat on the floor. 3. Tighten your buttocks muscles and lift your buttocks off of the floor until your waist is at almost the same height as your knees. You should feel the muscles working in your buttocks and the back of your thighs. If you do not feel these muscles, slide your feet 1-2 inches farther away from your buttocks. 4. Hold this position for 3-5  seconds. 5. Slowly lower your hips to the starting position, and allow your buttocks muscles to relax completely.  If this exercise is too easy, try doing it with your arms crossed over your chest. Abdominal Crunches  Repeat these steps 5-10 times: 1. Lie on your back on a firm bed or the floor with your legs extended. 2. Bend your knees so they are pointing toward the ceiling and your feet are flat on the floor. 3. Cross your arms over your chest. 4. Tip your chin slightly toward your chest without bending your neck. 5. Tighten your abdominal muscles and slowly raise your trunk (torso) high enough to lift your shoulder blades a tiny bit off of the floor. Avoid raising your torso higher than that, because it can put too much stress on your low back and it does not help to strengthen your abdominal muscles. 6. Slowly return to your starting position.  Back Lifts Repeat these steps 5-10 times: 1. Lie on your abdomen (face-down) with your arms at your sides, and rest your forehead on the floor. 2. Tighten the muscles in your legs and your buttocks. 3. Slowly lift your chest off of the floor while you keep your hips pressed to the floor. Keep the back of your head in line with the curve in your back. Your eyes should be looking at the floor. 4. Hold this position for 3-5 seconds. 5. Slowly return to your starting position.  Contact a health care provider if:  Your back pain or discomfort gets much worse when you do an exercise.  Your back pain or discomfort does not lessen within 2 hours  after you exercise. If you have any of these problems, stop doing these exercises right away. Do not do them again unless your health care provider says that you can. Get help right away if:  You develop sudden, severe back pain. If this happens, stop doing the exercises right away. Do not do them again unless your health care provider says that you can. This information is not intended to replace advice given to you by your health care provider. Make sure you discuss any questions you have with your health care provider. Document Released: 05/22/2004 Document Revised: 08/22/2015 Document Reviewed: 06/08/2014 Elsevier Interactive Patient Education  2017 [employer].

## 2018-02-12 ENCOUNTER — Ambulatory Visit: Payer: Managed Care, Other (non HMO) | Admitting: Family Medicine

## 2018-02-12 ENCOUNTER — Encounter: Payer: Self-pay | Admitting: Family Medicine

## 2018-02-12 ENCOUNTER — Encounter: Payer: Self-pay | Admitting: *Deleted

## 2018-02-12 VITALS — BP 120/73 | HR 73 | Temp 98.3°F | Resp 16 | Ht 62.0 in | Wt 165.0 lb

## 2018-02-12 DIAGNOSIS — G894 Chronic pain syndrome: Secondary | ICD-10-CM | POA: Diagnosis not present

## 2018-02-12 DIAGNOSIS — Z23 Encounter for immunization: Secondary | ICD-10-CM

## 2018-02-12 DIAGNOSIS — M2550 Pain in unspecified joint: Secondary | ICD-10-CM

## 2018-02-12 DIAGNOSIS — E669 Obesity, unspecified: Secondary | ICD-10-CM | POA: Diagnosis not present

## 2018-02-12 NOTE — Progress Notes (Signed)
OFFICE VISIT  02/12/2018   CC:  Chief Complaint  Patient presents with  . Follow-up    polyarthralgia/chronic pain   HPI:    Patient is a 22 y.o. Caucasian female who presents for 6 wk f/u chronic pain-->non-inflammatory polyarthralgia. She saw Dr. Corliss Skains 01/29/18 for rheum 2nd opinion-->dx'd with hypermobility arthralgia. No new rx meds.  Some natural antiinflamm supplements were discussed, as was PT. Pt not pursuing either of these options at this time.  Says the ultracet is helpful, avg use of 2 tabs per day, never more than 4 tabs per day. It can cause her insomnia, so she is trying to work around this. Most recent dose was about 24h ago.  Pt is pleased with the way things are being managed at this time. She seems to accept that she has a pretty definite/accurate diagnosis. She is happy that she no longer has to take high doses of NSAIDs.   Past Medical History:  Diagnosis Date  . 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 Nickola Major (Rheum).  As of 04/2015 f/u with Dr. Nickola Major, the impression is that she likely does not have an inflammatory arthritis.  F/u 26mo with Dr. Nickola Major.  Pt takes 400-600 ibup per day b/c this works better than meloxicam as of 01/2016.  Marland Kitchen Depression     Past Surgical History:  Procedure Laterality Date  . WISDOM TOOTH EXTRACTION      Outpatient Medications Prior to Visit  Medication Sig Dispense Refill  . SRONYX 0.1-20 MG-MCG tablet TAKE 1 TABLET BY MOUTH EVERY DAY 84 tablet 4  . traMADol-acetaminophen (ULTRACET) 37.5-325 MG tablet 1-2 tabs po bid prn pain 120 tablet 0   No facility-administered medications prior to visit.     Allergies  Allergen Reactions  . Cephalexin Hives    ROS As per HPI  PE: Blood pressure 120/73, pulse 73, temperature 98.3 F (36.8 C), temperature source Oral, resp. rate 16, height 5\' 2"  (1.575 m), weight 165 lb (74.8 kg), last menstrual period  01/22/2018, SpO2 100 %. Gen: Alert, well appearing.  Patient is oriented to person, place, time, and situation. AFFECT: pleasant, lucid thought and speech. No further exam today.  LABS:   Lab Results  Component Value Date   TSH 1.87 01/23/2014      Chemistry      Component Value Date/Time   NA 137 12/08/2017 1006   K 4.5 12/08/2017 1006   CL 104 12/08/2017 1006   CO2 29 12/08/2017 1006   BUN 9 12/08/2017 1006   CREATININE 1.02 12/08/2017 1006      Component Value Date/Time   CALCIUM 9.6 12/08/2017 1006   ALKPHOS 67 12/08/2017 1006   AST 15 12/08/2017 1006   ALT 14 12/08/2017 1006   BILITOT 0.5 12/08/2017 1006     Lab Results  Component Value Date   WBC 5.1 11/17/2014   HGB 12.4 11/17/2014   HCT 37.7 11/17/2014   MCV 79.3 11/17/2014   PLT 355.0 11/17/2014   Lab Results  Component Value Date   ESRSEDRATE 24 (H) 12/08/2017   IMPRESSION AND PLAN:  Chronic pain syndrome: hypermobility polyarthralgia. Doing well on reasonable doses of ultracet. Controlled substance contract reviewed with patient today.  Patient signed this and it will be placed in the chart.   UDS today. She did not need rx for ultracet today, so she'll call when next RF due.  An After Visit Summary was printed and given to the patient.  FOLLOW UP: Return in about 6 months (around 08/14/2018) for routine chronic illness f/u (chronic pain).  Signed:  Santiago Bumpers, MD           02/12/2018

## 2018-02-13 LAB — PAIN MGMT, PROFILE 8 W/CONF, U
6 Acetylmorphine: NEGATIVE ng/mL (ref <10)
Alcohol Metabolites: NEGATIVE ng/mL (ref <500)
Amphetamines: NEGATIVE ng/mL (ref <500)
BENZODIAZEPINES: NEGATIVE ng/mL (ref <100)
Buprenorphine, Urine: NEGATIVE ng/mL (ref <5)
Cocaine Metabolite: NEGATIVE ng/mL (ref <150)
Creatinine: 195.5 mg/dL
MDMA: NEGATIVE ng/mL (ref <500)
Marijuana Metabolite: NEGATIVE ng/mL (ref <20)
OXIDANT: NEGATIVE ug/mL (ref <200)
OXYCODONE: NEGATIVE ng/mL (ref <100)
Opiates: NEGATIVE ng/mL (ref <100)
pH: 5.93 (ref 4.5–9.0)

## 2018-02-15 ENCOUNTER — Encounter: Payer: Self-pay | Admitting: *Deleted

## 2018-02-15 ENCOUNTER — Telehealth: Payer: Self-pay | Admitting: Family Medicine

## 2018-02-15 NOTE — Telephone Encounter (Signed)
Left message for pt to call back  °

## 2018-02-15 NOTE — Telephone Encounter (Signed)
I saw pt's dad this morning and he asked [place] to call Tina Ortiz and explain her creatinine level in her urine.  The level was normal (anything > 20 is normal). They look at this test on urine drug screens mainly to confirm that the sample is actually urine (b/c some people will try to substitute colored water or other things that may look like urine).  Reassure her. -thx

## 2018-02-18 NOTE — Telephone Encounter (Signed)
Left detailed message on cell vm, okay per DPR.  

## 2018-03-05 ENCOUNTER — Ambulatory Visit: Payer: Self-pay | Admitting: Rheumatology

## 2018-03-10 ENCOUNTER — Other Ambulatory Visit: Payer: Self-pay | Admitting: Family Medicine

## 2018-03-10 MED ORDER — TRAMADOL-ACETAMINOPHEN 37.5-325 MG PO TABS: ORAL_TABLET | ORAL | 0 refills | Status: DC

## 2018-03-10 NOTE — Telephone Encounter (Signed)
Requested medication (s) are due for refill today -yes  Requested medication (s) are on the active medication list -yes  Future visit scheduled -yes  Last refill: 12/25/17  Notes to clinic: patient is requesting refill of non delegated Rx- sent for provider review.  Requested Prescriptions  Pending Prescriptions Disp Refills   traMADol-acetaminophen (ULTRACET) 37.5-325 MG tablet 120 tablet 0    Sig: 1-2 tabs po bid prn pain     Not Delegated - Analgesics:  Opioid Agonist Combinations Failed - 03/10/2018 12:27 PM      Failed - This refill cannot be delegated      Failed - Urine Drug Screen completed in last 360 days.      Passed - Valid encounter within last 6 months    Recent Outpatient Visits          3 weeks ago Chronic pain syndrome   [facility] [facility] McG[facility]en, Maryjean MornPhilip H, MD   2 months ago Arthralgia of both hands   [facility] [facility], Maryjean MornPhilip H, MD   3 months ago Arthralgia of both hands   [place] [facility] McG[facility]en, Maryjean MornPhilip H, MD   2 years ago Encounter for surveillance of contraceptive pills   [place] [facility] McG[facility], Maryjean MornPhilip H, MD   2 years ago Laceration of finger of left hand, subsequent encounter   [facility], Kashira V, PA-C      Future Appointments            In 5 months McGowen, Maryjean MornPhilip H, MD [place] [facility], [facility]            Requested Prescriptions  Pending Prescriptions Disp Refills   traMADol-acetaminophen (ULTRACET) 37.5-325 MG tablet 120 tablet 0    Sig: 1-2 tabs po bid prn pain     Not Delegated - Analgesics:  Opioid Agonist Combinations Failed - 03/10/2018 12:27 PM      Failed - This refill cannot be delegated      Failed - Urine Drug Screen completed in last 360 days.      Passed - Valid encounter within last 6 months    Recent Outpatient Visits          3 weeks ago Chronic pain syndrome   [place] [facility] McGowen, Maryjean MornPhilip  H, MD   2 months ago Arthralgia of both hands   [place] [facility], Maryjean MornPhilip H, MD   3 months ago Arthralgia of both hands   [place] [facility] McG[facility]en, Maryjean MornPhilip H, MD   2 years ago Encounter for surveillance of contraceptive pills    [facility] McG[facility]en, Maryjean MornPhilip H, MD   2 years ago Laceration of finger of left hand, subsequent encounter   [facility], [place] Min[place] V, PA-C      Future Appointments            In 5 months McGowen, Maryjean MornPhilip H, MD [facility], [facility]

## 2018-03-10 NOTE — Telephone Encounter (Signed)
Copied from CRM [phone]. Topic: Quick Communication - See Telephone Encounter >> Mar 10, 2018 12:21 PM Terisa Starraylor, Brittany L wrote: CRM for notification. See Telephone encounter for: 03/10/18.  traMADol-acetaminophen (ULTRACET) 37.5-325 MG tablet  [id number] [place] [address] [place] [id number]

## 2018-06-17 ENCOUNTER — Telehealth: Payer: Managed Care, Other (non HMO) | Admitting: Family

## 2018-06-17 DIAGNOSIS — Z719 Counseling, unspecified: Secondary | ICD-10-CM

## 2018-06-17 NOTE — Progress Notes (Signed)
Thank you for the details you included in the comment boxes. Those details are very helpful in determining the best course of treatment for you and help [place] to provide the best care.  Unfortunately, with the blood in urine, we are not able to treat you over the e-visit program. This requires a urine sample face-to-face and a physical exam.  Based on what you shared with me it looks like you have a serious condition that should be evaluated in a face to face office visit.  NOTE: If you entered your credit card information for this eVisit, you will not be charged. You may see a "hold" on your card for the $30 but that hold will drop off and you will not have a charge processed.  If you are having a true medical emergency please call 911.  If you need an urgent face to face visit, [facility] has four urgent care centers for your convenience.  If you need care fast and have a high deductible or no insurance consider:   [email or web address] to reserve your spot online an avoid wait times  [facility] [address], [address] [place], [place] [address] 8 am to 8 pm Monday-Friday 10 am to 4 pm Saturday-Sunday *Across the street from [employer]  [address] [place], [address] 8 am to 5 pm Monday-Friday * In the [facility] on the [facility]   The following sites will take your  insurance:  . [facility]  [phone] Get Driving Directions Find a Provider at this Location  [address] [place], [place] [address] . 10 am to 8 pm Monday-Friday . 12 pm to 8 pm Saturday-Sunday   . [facility] at [facility]  [phone] Get Driving Directions Find a Provider at this Location  [address], [address] [place], [place] [address] . 8 am to 8 pm Monday-Friday . 9 am to 6 pm Saturday . 11 am to 6 pm Sunday   . [facility] at [facility]  [phone] Get  Driving Directions  [address] [place], [place] [address] . 8 am to 8 pm Monday-Friday . 8 am to 4 pm Saturday-Sunday   Your e-visit answers were reviewed by a board certified advanced clinical practitioner to complete your personal care plan.  Thank you for using e-Visits.

## 2018-07-05 ENCOUNTER — Other Ambulatory Visit: Payer: Self-pay | Admitting: Family Medicine

## 2018-07-05 NOTE — Telephone Encounter (Signed)
RF request for tramadol/apap LOV: 02/12/18 Next ov: 08/13/18 Last written: 03/10/18 #120 w/ 0RF  Please advise. Thanks.

## 2018-07-06 MED ORDER — TRAMADOL-ACETAMINOPHEN 37.5-325 MG PO TABS: ORAL_TABLET | ORAL | 0 refills | Status: DC

## 2018-08-13 ENCOUNTER — Ambulatory Visit: Payer: Managed Care, Other (non HMO) | Admitting: Family Medicine

## 2018-09-15 ENCOUNTER — Encounter: Payer: Self-pay | Admitting: Family Medicine

## 2018-09-15 ENCOUNTER — Ambulatory Visit (INDEPENDENT_AMBULATORY_CARE_PROVIDER_SITE_OTHER): Payer: Managed Care, Other (non HMO) | Admitting: Family Medicine

## 2018-09-15 ENCOUNTER — Other Ambulatory Visit: Payer: Self-pay

## 2018-09-15 ENCOUNTER — Ambulatory Visit: Payer: Managed Care, Other (non HMO) | Admitting: Family Medicine

## 2018-09-15 DIAGNOSIS — F5104 Psychophysiologic insomnia: Secondary | ICD-10-CM

## 2018-09-15 DIAGNOSIS — F320 Major depressive disorder, single episode, mild: Secondary | ICD-10-CM

## 2018-09-15 DIAGNOSIS — F411 Generalized anxiety disorder: Secondary | ICD-10-CM | POA: Diagnosis not present

## 2018-09-15 MED ORDER — FLUOXETINE HCL 20 MG PO TABS
20.0000 mg | ORAL_TABLET | Freq: Every day | ORAL | 1 refills | Status: DC
Start: 2018-09-15 — End: 2018-11-15

## 2018-09-15 MED ORDER — LORAZEPAM 0.5 MG PO TABS: ORAL_TABLET | ORAL | 0 refills | Status: DC

## 2018-09-15 MED ORDER — TRAMADOL-ACETAMINOPHEN 37.5-325 MG PO TABS: ORAL_TABLET | ORAL | 0 refills | Status: DC

## 2018-09-15 NOTE — Progress Notes (Signed)
Virtual Visit via Video Note  I connected with pt on 09/15/18 at  1:00 PM EDT by a video enabled telemedicine application and verified that I am speaking with the correct person using two identifiers.  Location patient: home Location provider:work or home office Persons participating in the virtual visit: patient, provider  I discussed the limitations of evaluation and management by telemedicine and the availability of in person appointments. The patient expressed understanding and agreed to proceed.   HPI: 23 y/o WF being seen today for f/u chronic myalgia pain of multiple joints.  Indication for chronic opioid: see above. Medication and dose: ultracet 37.5/325, 1-2 qid prn # pills per month: 120 pills typically lasts her 3-4 mo.  :ast filled 07/06/18. Last UDS date: 02/12/18 Opioid Treatment Agreement signed (Y/N): 02/12/18 Opioid Treatment Agreement last reviewed with patient:  today [facility] reviewed this encounter (include red flags):  Y, no red flags  Pain: pain is a bit more last couple months b/c of inactivity from covid restrictions. No adverse effects noted.  Takes 1-2 ultracet per day, as per her usual.  Trouble sleeping a lot more lately.  Goes to bed 11 pm, no sleep until 2 AM. Ruminates on anxieties/worries.  Good sleep hygiene.  No signif caffeine intake. No otc sleep aids.   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 Appetite is fine.  She does have depressed mood.  No anhedonia or isolating behavior.  No SI or HI. Some hopelessness and guilt.  No irritability or restlessness. No panic attacks.  Occ crying spells.  ROS: no CP, no SOB, no wheezing, no cough, no dizziness, no HAs, no rashes, no melena/hematochezia.  No polyuria or polydipsia.     Past Medical History:  Diagnosis Date  . Arthralgia of multiple sites    Multiple rheum w/u's neg.  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 Depression     Past Surgical History:  Procedure Laterality Date  . WISDOM TOOTH EXTRACTION      Family History  Problem Relation Age of Onset  . Thyroid disease Mother   . Lupus Father   . Hypertension Father   . Arthritis Maternal Grandmother        osteo  . Diabetes Maternal Grandmother   . Alcohol abuse Maternal Grandfather   . Lupus Paternal Grandmother   . Hyperlipidemia Paternal Grandmother      Current Outpatient Medications:  .  SRONYX 0.1-20 MG-MCG tablet, TAKE 1 TABLET BY MOUTH EVERY DAY, Disp: 84 tablet, Rfl: 4 .  traMADol-acetaminophen (ULTRACET) 37.5-325 MG tablet, 1-2 tabs po bid prn pain, Disp: 120 tablet, Rfl: 0  EXAM:  VITALS per patient if applicable: There were no vitals taken for this visit.   GENERAL: alert, oriented, appears well and in no acute distress  HEENT: atraumatic, conjunttiva clear, no obvious abnormalities on inspection of external nose and ears  NECK: normal movements of the head and neck  LUNGS: on inspection no signs of respiratory distress, breathing rate appears normal, no obvious gross SOB, gasping or wheezing  CV: no obvious cyanosis  MS: moves all visible extremities without noticeable abnormality  PSYCH/NEURO: pleasant and cooperative, no obvious depression or anxiety, speech and thought processing grossly intact  LABS: none today    Chemistry      Component Value Date/Time   NA 137 12/08/2017 1006   K 4.5 12/08/2017 1006   CL 104 12/08/2017 1006  CO2 29 12/08/2017 1006   BUN 9 12/08/2017 1006   CREATININE 1.02 12/08/2017 1006      Component Value Date/Time   CALCIUM 9.6 12/08/2017 1006   ALKPHOS 67 12/08/2017 1006   AST 15 12/08/2017 1006   ALT 14 12/08/2017 1006   BILITOT 0.5 12/08/2017 1006     ASSESSMENT AND PLAN:  Discussed the following assessment and plan:  1) GAD: start fluoxetine 20mg  qd. Lorazepam 0.5mg ,1-2 tabs bid prn, #60, no RF.  Therapeutic expectations and side effect  profile of medication discussed today.  Patient's questions answered. She is on the waiting list for counselors in [place] where she lives.  2) MDD, single episode, mild. See above #1.    3) Insomnia: secondary to #1 and #2 above. Lorazepam as in #1 above.  4) Chronic polyarthralgia, idiopathic. Doing fine on prn ultracet, renewed rx for #120 pills today, no RF. CSC UTD, as is her UDS, will get UDS and renew CSC at next f/u for pain in 6 mo.   I discussed the assessment and treatment plan with the patient. The patient was provided an opportunity to ask questions and all were answered. The patient agreed with the plan and demonstrated an understanding of the instructions.   The patient was advised to call back or seek an in-person evaluation if the symptoms worsen or if the condition fails to improve as anticipated.  F/u: 1 mo f/u anx/dep/insom  Signed:  Santiago Bumpers, MD           09/15/2018

## 2018-09-17 ENCOUNTER — Telehealth: Payer: Self-pay

## 2018-09-17 NOTE — Telephone Encounter (Signed)
Pt has been scheduled for 1 month f/u and 6 month f/u. Copied from CRM [id number]. Topic: General - Inquiry >> Sep 17, 2018  2:10 PM Deborha Payment wrote: Reason for CRM: Patient is calling to make a follow up appt with PCP Patient call back # [phone]

## 2018-10-08 ENCOUNTER — Other Ambulatory Visit: Payer: Self-pay | Admitting: Family Medicine

## 2018-10-18 ENCOUNTER — Other Ambulatory Visit: Payer: Self-pay

## 2018-10-18 ENCOUNTER — Ambulatory Visit (INDEPENDENT_AMBULATORY_CARE_PROVIDER_SITE_OTHER): Payer: Managed Care, Other (non HMO) | Admitting: Family Medicine

## 2018-10-18 ENCOUNTER — Encounter: Payer: Self-pay | Admitting: Family Medicine

## 2018-10-18 DIAGNOSIS — F411 Generalized anxiety disorder: Secondary | ICD-10-CM

## 2018-10-18 DIAGNOSIS — F3341 Major depressive disorder, recurrent, in partial remission: Secondary | ICD-10-CM | POA: Diagnosis not present

## 2018-10-18 NOTE — Progress Notes (Signed)
OFFICE VISIT  10/18/2018   CC:  Chief Complaint  Patient presents with  . Follow-up    anx/dep/insomnia   Virtual Visit via Video Note  I connected with Tina Ortiz ([id number]) on 10/18/18 at 10:30 AM by a video enabled telemedicine application and verified that I am speaking with the correct person using two identifiers.  Location patient: home Location provider:work or home office Persons participating in the virtual visit: patient, provider  I discussed the limitations of evaluation and management by telemedicine and the availability of in person appointments. The patient expressed understanding and agreed to proceed.  Telemedicine visit is a necessity given the COVID-19 restrictions in place at the current time.  HPI:    Patient is a 23 y.o. Caucasian female who presents for 1 mo f/u anxiety and depression.  She was having significant probs with insomnia at that time as well. Started her on fluoxetine 20mg  and lorazepam 0.5mg  (1-2 bid prn, #60) last visit.  She is 30-40% improved by her estimation.  Only started the fluox about 3 wks ago. She has tried the B[employer]'sloraz a few times---mixed results but no adverse effects.  Only takes1 tab at a time so far. She is working on moving to the [place] area so will be busy with this over the next month. She has some mild nausea and appetite suppression on the 20mg  fluox but this is dissipating slowly lately.  No new questions.   Past Medical History:  Diagnosis Date  . Arthralgia of multiple sites    Multiple rheum w/u's neg.  01/2018 rheum eval #2->hypermobility arthralgias, pes planus severe  . Depression   . GAD (generalized anxiety disorder)     Past Surgical History:  Procedure Laterality Date  . WISDOM TOOTH EXTRACTION      Outpatient Medications Prior to Visit  Medication Sig Dispense Refill  . FLUoxetine (PROZAC) 20 MG tablet Take 1 tablet (20 mg total) by mouth daily. 30 tablet 1  . LORazepam (ATIVAN) 0.5 MG tablet 1-2 tabs  po bid prn anxiety and/or insomnia 60 tablet 0  . SRONYX 0.1-20 MG-MCG tablet TAKE 1 TABLET BY MOUTH EVERY DAY 84 tablet 4  . traMADol-acetaminophen (ULTRACET) 37.5-325 MG tablet 1-2 tabs po bid prn pain 120 tablet 0   No facility-administered medications prior to visit.     Allergies  Allergen Reactions  . Cephalexin Hives    ROS As per HPI  PE: There were no vitals taken for this visit. Gen: Alert, well appearing.  Patient is oriented to person, place, time, and situation. AFFECT: pleasant, lucid thought and speech.  HEENT: atraumatic, conjunttiva clear, no obvious abnormalities on inspection of external nose and ears  NECK: normal movements of the head and neck  LUNGS: on inspection no signs of respiratory distress, breathing rate appears normal, no obvious gross SOB, gasping or wheezing  CV: no obvious cyanosis  MS: moves all visible extremities without noticeable abnormality  PSYCH/NEURO: pleasant and cooperative, no obvious depression or anxiety, speech and thought processing grossly intact    LABS:    Chemistry      Component Value Date/Time   NA 137 12/08/2017 1006   K 4.5 12/08/2017 1006   CL 104 12/08/2017 1006   CO2 29 12/08/2017 1006   BUN 9 12/08/2017 1006   CREATININE 1.02 12/08/2017 1006      Component Value Date/Time   CALCIUM 9.6 12/08/2017 1006   ALKPHOS 67 12/08/2017 1006   AST 15 12/08/2017 1006   ALT 14  12/08/2017 1006   BILITOT 0.5 12/08/2017 1006      IMPRESSION AND PLAN:  1) MDD, in partial remission. GAD improving as well. Continue fluox 20mg  qd and f/u in 1 mo and hopefully her mild nausea and appetite suppression side effects will have resolved by then.  Discussed possible need to increase dose at that time depending on her response. I encouraged her to continue to try loraz prn, try 2 tabs on some occasions to gauge the level of dose she may need according to the level of stress/anxiety she is feeling---in order to try to get a  more consistent positive effect.  An After Visit Summary was printed and given to the patient.  FOLLOW UP: Return in about 4 weeks (around 11/15/2018) for f/u dep/anx.  Signed:  Crissie Sickles, MD           10/18/2018

## 2018-11-04 ENCOUNTER — Other Ambulatory Visit: Payer: Self-pay | Admitting: Family Medicine

## 2018-11-05 ENCOUNTER — Other Ambulatory Visit: Payer: Self-pay | Admitting: Family Medicine

## 2018-11-15 ENCOUNTER — Encounter: Payer: Self-pay | Admitting: Family Medicine

## 2018-11-15 ENCOUNTER — Telehealth: Payer: Self-pay

## 2018-11-15 ENCOUNTER — Other Ambulatory Visit: Payer: Self-pay

## 2018-11-15 ENCOUNTER — Ambulatory Visit (INDEPENDENT_AMBULATORY_CARE_PROVIDER_SITE_OTHER): Payer: Managed Care, Other (non HMO) | Admitting: Family Medicine

## 2018-11-15 DIAGNOSIS — F411 Generalized anxiety disorder: Secondary | ICD-10-CM

## 2018-11-15 DIAGNOSIS — F325 Major depressive disorder, single episode, in full remission: Secondary | ICD-10-CM

## 2018-11-15 MED ORDER — FLUOXETINE HCL 20 MG PO TABS: 20.0000 mg | ORAL_TABLET | Freq: Every day | ORAL | 1 refills | Status: DC

## 2018-11-15 NOTE — Telephone Encounter (Addendum)
Pt had appointment today with PCP and requested change in pharmacy. [facility] [address] in [place], [place].

## 2018-11-15 NOTE — Progress Notes (Signed)
Virtual Visit via Video Note  I connected with pt on 11/15/18 at 11:00 AM EDT by a video enabled telemedicine application and verified that I am speaking with the correct person using two identifiers.  Location patient: home Location provider:work or home office Persons participating in the virtual visit: patient, provider  I discussed the limitations of evaluation and management by telemedicine and the availability of in person appointments. The patient expressed understanding and agreed to proceed.  Telemedicine visit is a necessity given the COVID-19 restrictions in place at the current time.  HPI: 23 y/o WF being seen today for 1 mo f/u anxiety and depression with some insomnia largely related to this.  Last month she was mildly improved on fluoxetine x 3 wks.  She was having some side effect of nausea/appetite suppression but these sx's seemed to be waning.  We chose to keep her on same dose (20mg  qd).  Encouraged pt to try her loraz at 2 tabs on some occasions to better gauze effect.  She is feeling 90% improved now!  All side effects have worn off completely. No new complaints.  Anxiety level stable.  No panic.  Sleep is going good now.  She is interviewing for jobs, currently still in [place], [place].  Wants to work/live in [place] area [place].    ROS: See pertinent positives and negatives per HPI.  Past Medical History:  Diagnosis Date  . Arthralgia of multiple sites    Multiple rheum w/u's neg.  01/2018 rheum eval #2->hypermobility arthralgias, pes planus severe  . Depression   . GAD (generalized anxiety disorder)     Past Surgical History:  Procedure Laterality Date  . WISDOM TOOTH EXTRACTION      Family History  Problem Relation Age of Onset  . Thyroid disease Mother   . Lupus Father   . Hypertension Father   . Arthritis Maternal Grandmother        osteo  . Diabetes Maternal Grandmother   . Alcohol abuse Maternal Grandfather   . Lupus Paternal Grandmother    . Hyperlipidemia Paternal Grandmother      Current Outpatient Medications:  .  FLUoxetine (PROZAC) 20 MG tablet, Take 1 tablet (20 mg total) by mouth daily., Disp: 30 tablet, Rfl: 1 .  LORazepam (ATIVAN) 0.5 MG tablet, 1-2 tabs po bid prn anxiety and/or insomnia, Disp: 60 tablet, Rfl: 0 .  SRONYX 0.1-20 MG-MCG tablet, TAKE 1 TABLET BY MOUTH EVERY DAY, Disp: 84 tablet, Rfl: 4 .  traMADol-acetaminophen (ULTRACET) 37.5-325 MG tablet, 1-2 tabs po bid prn pain, Disp: 120 tablet, Rfl: 0  EXAM:  VITALS per patient if applicable: There were no vitals taken for this visit.   GENERAL: alert, oriented, appears well and in no acute distress  HEENT: atraumatic, conjunttiva clear, no obvious abnormalities on inspection of external nose and ears  NECK: normal movements of the head and neck  LUNGS: on inspection no signs of respiratory distress, breathing rate appears normal, no obvious gross SOB, gasping or wheezing  CV: no obvious cyanosis  MS: moves all visible extremities without noticeable abnormality  PSYCH/NEURO: pleasant and cooperative, no obvious depression or anxiety, speech and thought processing grossly intact  LABS: none today    Chemistry      Component Value Date/Time   NA 137 12/08/2017 1006   K 4.5 12/08/2017 1006   CL 104 12/08/2017 1006   CO2 29 12/08/2017 1006   BUN 9 12/08/2017 1006   CREATININE 1.02 12/08/2017 1006  Component Value Date/Time   CALCIUM 9.6 12/08/2017 1006   ALKPHOS 67 12/08/2017 1006   AST 15 12/08/2017 1006   ALT 14 12/08/2017 1006   BILITOT 0.5 12/08/2017 1006      ASSESSMENT AND PLAN:  Discussed the following assessment and plan:  MDD, in full remission now.  GAD well controlled. Continue fluoxetine 20mg  qd.  Discussed length of treatment goal is about 1 yr, then can consider ween off med if she desires. She'll continue lorazepam prn. She is currently still seeking counseling in the [place] area for her anxiety.  Of note,  [facility] [address] in [place], [place]. Will update CSC.   I discussed the assessment and treatment plan with the patient. The patient was provided an opportunity to ask questions and all were answered. The patient agreed with the plan and demonstrated an understanding of the instructions.   The patient was advised to call back or seek an in-person evaluation if the symptoms worsen or if the condition fails to improve as anticipated.  F/u:  1 mo f/u chronic pain   Signed:  Santiago BumpersPhil Miarose Lippert, MD           11/15/2018

## 2018-11-16 DIAGNOSIS — M1990 Unspecified osteoarthritis, unspecified site: Secondary | ICD-10-CM | POA: Insufficient documentation

## 2018-11-30 ENCOUNTER — Telehealth: Payer: Self-pay | Admitting: Family Medicine

## 2018-11-30 NOTE — Telephone Encounter (Signed)
Pt is getting ready to have a drug test and she needs a letter stating she is on tramadol and lorazepam. Patient will pick up letter

## 2018-11-30 NOTE — Telephone Encounter (Signed)
FYI

## 2018-12-01 NOTE — Telephone Encounter (Signed)
Called pt and advised letter is at front desk for pick up. She will be here on Friday to pick up.

## 2018-12-01 NOTE — Telephone Encounter (Signed)
Letter printed & signed.

## 2018-12-01 NOTE — Telephone Encounter (Signed)
Ok to write letter advising of patient current medication

## 2018-12-07 ENCOUNTER — Other Ambulatory Visit: Payer: Self-pay | Admitting: Family Medicine

## 2018-12-07 NOTE — Telephone Encounter (Signed)
Pt has appt tomorrow for f/u chronic pain. Request for 90 d supply instead of 30.  Medication pending, please advise, thanks.

## 2018-12-08 ENCOUNTER — Ambulatory Visit (INDEPENDENT_AMBULATORY_CARE_PROVIDER_SITE_OTHER): Payer: Managed Care, Other (non HMO) | Admitting: Family Medicine

## 2018-12-08 ENCOUNTER — Other Ambulatory Visit: Payer: Self-pay

## 2018-12-08 ENCOUNTER — Encounter: Payer: Self-pay | Admitting: Family Medicine

## 2018-12-08 VITALS — Ht 62.0 in

## 2018-12-08 DIAGNOSIS — M130 Polyarthritis, unspecified: Secondary | ICD-10-CM

## 2018-12-08 DIAGNOSIS — Z79899 Other long term (current) drug therapy: Secondary | ICD-10-CM

## 2018-12-08 DIAGNOSIS — G894 Chronic pain syndrome: Secondary | ICD-10-CM | POA: Diagnosis not present

## 2018-12-08 NOTE — Progress Notes (Signed)
Virtual Visit via Video Note  I connected with pt on 12/08/18 at  4:00 PM EDT by a video enabled telemedicine application and verified that I am speaking with the correct person using two identifiers.  Location patient: home Location provider:work or home office Persons participating in the virtual visit: patient, provider  I discussed the limitations of evaluation and management by telemedicine and the availability of in person appointments. The patient expressed understanding and agreed to proceed.   HPI: 23 y/o WF being seen today for f/u chronic pain syndrome.  Indication for chronic opioid: non-inflammatory polyarthralgia. She saw Dr. Corliss Skainseveshwar 01/29/18 for rheum 2nd opinion-->dx'd with hypermobility arthralgia. Medication and dose: ultracet 37.5/325, 1-2 bid prn # pills per month: 120-->usually lasts 2 mo or so. Last UDS date: 02/12/18 Opioid Treatment Agreement signed (Y/N): Y, 02/12/18. Opioid Treatment Agreement last reviewed with patient:  today [facility] reviewed this encounter (include red flags):  Reviewed today:  most recent fill of ultracet was 09/15/18, #120, rx'd by me.  No suspicious activity.  Pain still well controlled with taking 2-3 ultracet per day. She hurts the most in her neck and upper back.  Uses heat and does stretches regularly. No side effects from her pain med use.  She is looking forward to starting a new job in [place] .  She will be moving there in about a week but will still be seeing me as her MD. 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   ROS: See pertinent positives and negatives per HPI.  Past Medical History:  Diagnosis Date  . Arthralgia of multiple sites    Multiple rheum w/u's neg.  01/2018 rheum eval #2->hypermobility arthralgias, pes planus severe  . Depression   . GAD (generalized anxiety disorder)     Past Surgical History:  Procedure Laterality Date  . WISDOM TOOTH EXTRACTION      Family History  Problem Relation Age  of Onset  . Thyroid disease Mother   . Lupus Father   . Hypertension Father   . Arthritis Maternal Grandmother        osteo  . Diabetes Maternal Grandmother   . Alcohol abuse Maternal Grandfather   . Lupus Paternal Grandmother   . Hyperlipidemia Paternal Grandmother      Current Outpatient Medications:  .  Fluoxetine HCl, PMDD, 20 MG TABS, TAKE 1 TABLET BY MOUTH EVERY DAY, Disp: 90 tablet, Rfl: 3 .  LORazepam (ATIVAN) 0.5 MG tablet, 1-2 tabs po bid prn anxiety and/or insomnia, Disp: 60 tablet, Rfl: 0 .  SRONYX 0.1-20 MG-MCG tablet, TAKE 1 TABLET BY MOUTH EVERY DAY, Disp: 84 tablet, Rfl: 4 .  traMADol-acetaminophen (ULTRACET) 37.5-325 MG tablet, 1-2 tabs po bid prn pain, Disp: 120 tablet, Rfl: 0  EXAM:  VITALS per patient if applicable: Ht 5\' 2"  (1.575 m)   LMP 12/06/2018 (Exact Date)   BMI 30.18 kg/m    GENERAL: alert, oriented, appears well and in no acute distress  HEENT: atraumatic, conjunttiva clear, no obvious abnormalities on inspection of external nose and ears  NECK: normal movements of the head and neck  LUNGS: on inspection no signs of respiratory distress, breathing rate appears normal, no obvious gross SOB, gasping or wheezing  CV: no obvious cyanosis  MS: moves all visible extremities without noticeable abnormality  PSYCH/NEURO: pleasant and cooperative, no obvious depression or anxiety, speech and thought processing grossly intact  LABS: none today    Chemistry      Component Value Date/Time   NA  137 12/08/2017 1006   K 4.5 12/08/2017 1006   CL 104 12/08/2017 1006   CO2 29 12/08/2017 1006   BUN 9 12/08/2017 1006   CREATININE 1.02 12/08/2017 1006      Component Value Date/Time   CALCIUM 9.6 12/08/2017 1006   ALKPHOS 67 12/08/2017 1006   AST 15 12/08/2017 1006   ALT 14 12/08/2017 1006   BILITOT 0.5 12/08/2017 1006     ASSESSMENT AND PLAN:  Discussed the following assessment and plan:  Chronic pain syndrome. Non-inflammatory polyarthritis  (hypermobility). All stable.  VEry reasonable/responsible use of ultracet. She will come by at her earliest convenience for lab visit to do UDS and sign CSC. No new rx's were done today.  I discussed the assessment and treatment plan with the patient. The patient was provided an opportunity to ask questions and all were answered. The patient agreed with the plan and demonstrated an understanding of the instructions.   The patient was advised to call back or seek an in-person evaluation if the symptoms worsen or if the condition fails to improve as anticipated.  F/u: 6 mo  Signed:  Crissie Sickles, MD           12/08/2018

## 2018-12-15 ENCOUNTER — Ambulatory Visit: Payer: Managed Care, Other (non HMO) | Admitting: Family Medicine

## 2018-12-22 ENCOUNTER — Ambulatory Visit (INDEPENDENT_AMBULATORY_CARE_PROVIDER_SITE_OTHER): Payer: Managed Care, Other (non HMO) | Admitting: Family Medicine

## 2018-12-22 ENCOUNTER — Other Ambulatory Visit: Payer: Self-pay

## 2018-12-22 DIAGNOSIS — G894 Chronic pain syndrome: Secondary | ICD-10-CM | POA: Diagnosis not present

## 2018-12-22 DIAGNOSIS — M130 Polyarthritis, unspecified: Secondary | ICD-10-CM

## 2018-12-22 DIAGNOSIS — Z79899 Other long term (current) drug therapy: Secondary | ICD-10-CM

## 2018-12-23 LAB — PAIN MGMT, PROFILE 8 W/CONF, U
6 Acetylmorphine: NEGATIVE ng/mL
Alcohol Metabolites: NEGATIVE ng/mL (ref <500)
Amphetamines: NEGATIVE ng/mL
Benzodiazepines: NEGATIVE ng/mL
Buprenorphine, Urine: NEGATIVE ng/mL
Cocaine Metabolite: NEGATIVE ng/mL
Creatinine: 70.3 mg/dL
MDMA: NEGATIVE ng/mL
Marijuana Metabolite: NEGATIVE ng/mL
Opiates: NEGATIVE ng/mL
Oxidant: NEGATIVE ug/mL
Oxycodone: NEGATIVE ng/mL
pH: 7.2 (ref 4.5–9.0)

## 2018-12-24 ENCOUNTER — Telehealth: Payer: Self-pay | Admitting: Family Medicine

## 2018-12-24 MED ORDER — TRAMADOL-ACETAMINOPHEN 37.5-325 MG PO TABS: ORAL_TABLET | ORAL | 1 refills | Status: DC

## 2018-12-24 MED ORDER — LORAZEPAM 0.5 MG PO TABS: ORAL_TABLET | ORAL | 5 refills | Status: DC

## 2018-12-24 NOTE — Telephone Encounter (Signed)
UDS with appropriate results. Ativan and ultracet eRx'd.

## 2018-12-24 NOTE — Telephone Encounter (Signed)
RF request for patient's ativan, and ultracet.  Last OV 12/08/2018. Next OV 03/21/2019 Last Ativan RX 09/15/18 # 60 x 0 RF Last Ultracet RX 09/15/2018 # 120 x 0 RF.  Please advise.

## 2018-12-24 NOTE — Addendum Note (Signed)
Addended by: Tammi Sou on: 12/24/2018 09:53 PM   Modules accepted: Orders

## 2018-12-24 NOTE — Telephone Encounter (Signed)
Pt had to come and take UDS. Dr Anitra Lauth on vacation and these results have not been read yet. Will send to Dr Anitra Lauth to review upon his return

## 2018-12-27 NOTE — Telephone Encounter (Signed)
Detailed message of appropriate results and RX's sent to pharmacy.  Okay per DPR.

## 2018-12-29 ENCOUNTER — Telehealth: Payer: Self-pay

## 2018-12-29 NOTE — Telephone Encounter (Signed)
Have her call back to request next rx. Pls change her preferred pharmacy in her chart.-thx

## 2018-12-29 NOTE — Telephone Encounter (Signed)
Left message for patient to call back. Need to clarify which pharmacy she is using for Lorazepam. [facility]. Today receiving [facility] for [facility] [place].

## 2018-12-29 NOTE — Telephone Encounter (Signed)
Patient advised, pharmacy updated in the chart

## 2018-12-29 NOTE — Telephone Encounter (Signed)
Patient called back. Patient's parent picked up RXs for Lorazepam and Ultracet [facility] but for future refills she would need them [facility] to [facility] [place]. Patient lives in [place] now. Can this be sent in now for them to hold till next refill or does patient need to call back closer to her next refill due date to get this sent?

## 2019-03-16 ENCOUNTER — Other Ambulatory Visit: Payer: Self-pay

## 2019-03-16 ENCOUNTER — Ambulatory Visit: Payer: Managed Care, Other (non HMO) | Admitting: Family Medicine

## 2019-03-21 ENCOUNTER — Ambulatory Visit: Payer: Managed Care, Other (non HMO) | Admitting: Family Medicine

## 2019-04-29 DIAGNOSIS — I471 Supraventricular tachycardia, unspecified: Secondary | ICD-10-CM

## 2019-04-29 HISTORY — DX: Supraventricular tachycardia: I47.1

## 2019-04-29 HISTORY — DX: Supraventricular tachycardia, unspecified: I47.10

## 2019-06-09 ENCOUNTER — Ambulatory Visit: Payer: Managed Care, Other (non HMO) | Admitting: Family Medicine

## 2019-06-23 ENCOUNTER — Other Ambulatory Visit: Payer: Self-pay

## 2019-06-23 ENCOUNTER — Encounter: Payer: Managed Care, Other (non HMO) | Admitting: Family Medicine

## 2019-06-23 NOTE — Progress Notes (Deleted)
Virtual Visit via Video Note  I connected with pt on 06/23/19 at  3:30 PM EST by a video enabled telemedicine application and verified that I am speaking with the correct person using two identifiers.  Location patient: home Location provider:work or home office Persons participating in the virtual visit: patient, provider  I discussed the limitations of evaluation and management by telemedicine and the availability of in person appointments. The patient expressed understanding and agreed to proceed.  Telemedicine visit is a necessity given the COVID-19 restrictions in place at the current time.  HPI: 24 y/o WF being seen today for 6 mo f/u chronic pain syndrome as well as f/u depression and anxiety. A/P as of last f/u:  "Chronic pain syndrome. Non-inflammatory polyarthritis/polyarthralgia (hypermobility). All stable.  VEry reasonable/responsible use of ultracet. She will come by at her earliest convenience for lab visit to do UDS and sign CSC. No new rx's were done today."  Interim hx: UDS 12/22/18 with appropriate findings. 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  Indication for chronic opioid: non-inflammatorypolyarthralgia. She saw Dr. Corliss Skains 01/29/18 for rheum 2nd opinion-->dx'd with hypermobility arthralgia. Medication and dose: ultracet 37.5/325, 1-2 bid prn # pills per month: 120-->usually lasts 2 mo or so. Last UDS date: 12/22/18 Opioid Treatment Agreement signed (Y/N): Y, 12/22/18. Opioid Treatment Agreement last reviewed with patient:  today PMP AWARE reviewed today: most recent rx for *** was filled ***, # ***, rx by ***. No red flags.   Anx/dep: last visit 10/2018 she was in remission after having been on fluoxetine 20mg  for 6 wks or so. Was using lorazepam sparingly for prn anxiety worsening.    ROS: See pertinent positives and negatives per HPI.  Past Medical History:  Diagnosis Date  . Arthralgia  of multiple sites    Multiple rheum w/u's neg.  01/2018 rheum eval #2->hypermobility arthralgias, pes planus severe  . Depression   . GAD (generalized anxiety disorder)     Past Surgical History:  Procedure Laterality Date  . WISDOM TOOTH EXTRACTION      Family History  Problem Relation Age of Onset  . Thyroid disease Mother   . Lupus Father   . Hypertension Father   . Arthritis Maternal Grandmother        osteo  . Diabetes Maternal Grandmother   . Alcohol abuse Maternal Grandfather   . Lupus Paternal Grandmother   . Hyperlipidemia Paternal Grandmother      Current Outpatient Medications:  .  Fluoxetine HCl, PMDD, 20 MG TABS, TAKE 1 TABLET BY MOUTH EVERY DAY, Disp: 90 tablet, Rfl: 3 .  LORazepam (ATIVAN) 0.5 MG tablet, 1-2 tabs po bid prn anxiety and/or insomnia, Disp: 60 tablet, Rfl: 5 .  SRONYX 0.1-20 MG-MCG tablet, TAKE 1 TABLET BY MOUTH EVERY DAY, Disp: 84 tablet, Rfl: 4 .  traMADol-acetaminophen (ULTRACET) 37.5-325 MG tablet, 1-2 tabs po bid prn pain, Disp: 120 tablet, Rfl: 1  EXAM:  VITALS per patient if applicable: There were no vitals taken for this visit.   GENERAL: alert, oriented, appears well and in no acute distress  HEENT: atraumatic, conjunttiva clear, no obvious abnormalities on inspection of external nose and ears  NECK: normal movements of the head and neck  LUNGS: on inspection no signs of respiratory distress, breathing rate appears normal, no obvious gross SOB, gasping or wheezing  CV: no obvious cyanosis  MS: moves all visible extremities without noticeable abnormality  PSYCH/NEURO:  pleasant and cooperative, no obvious depression or anxiety, speech and thought processing grossly intact  ASSESSMENT AND PLAN:  Discussed the following assessment and plan:  No diagnosis found.  -we discussed possible serious and likely etiologies, options for evaluation and workup, limitations of telemedicine visit vs in person visit, treatment, treatment  risks and precautions. Pt prefers to treat via telemedicine empirically rather then risking or undertaking an in person visit at this moment. Patient agrees to seek prompt in person care if worsening, new symptoms arise, or if is not improving with treatment.   I discussed the assessment and treatment plan with the patient. The patient was provided an opportunity to ask questions and all were answered. The patient agreed with the plan and demonstrated an understanding of the instructions.   The patient was advised to call back or seek an in-person evaluation if the symptoms worsen or if the condition fails to improve as anticipated.   Tammi Sou, MD

## 2019-06-27 ENCOUNTER — Other Ambulatory Visit: Payer: Self-pay | Admitting: Family Medicine

## 2019-06-29 ENCOUNTER — Other Ambulatory Visit: Payer: Self-pay

## 2019-06-29 ENCOUNTER — Encounter: Payer: Self-pay | Admitting: Family Medicine

## 2019-06-29 ENCOUNTER — Ambulatory Visit (INDEPENDENT_AMBULATORY_CARE_PROVIDER_SITE_OTHER): Payer: Managed Care, Other (non HMO) | Admitting: Family Medicine

## 2019-06-29 ENCOUNTER — Ambulatory Visit: Payer: Managed Care, Other (non HMO) | Admitting: Family Medicine

## 2019-06-29 DIAGNOSIS — F3342 Major depressive disorder, recurrent, in full remission: Secondary | ICD-10-CM | POA: Diagnosis not present

## 2019-06-29 DIAGNOSIS — R5382 Chronic fatigue, unspecified: Secondary | ICD-10-CM | POA: Diagnosis not present

## 2019-06-29 DIAGNOSIS — R0683 Snoring: Secondary | ICD-10-CM

## 2019-06-29 DIAGNOSIS — M2550 Pain in unspecified joint: Secondary | ICD-10-CM

## 2019-06-29 DIAGNOSIS — R002 Palpitations: Secondary | ICD-10-CM

## 2019-06-29 DIAGNOSIS — I471 Supraventricular tachycardia, unspecified: Secondary | ICD-10-CM

## 2019-06-29 DIAGNOSIS — G894 Chronic pain syndrome: Secondary | ICD-10-CM

## 2019-06-29 MED ORDER — FLUOXETINE HCL (PMDD) 20 MG PO TABS: ORAL_TABLET | ORAL | 3 refills | Status: DC

## 2019-06-29 MED ORDER — BUPROPION HCL ER (XL) 150 MG PO TB24: 150.0000 mg | ORAL_TABLET | Freq: Every day | ORAL | 1 refills | Status: DC

## 2019-06-29 NOTE — Progress Notes (Signed)
Virtual Visit via Video Note  I connected with pt on 06/29/19 at  4:00 PM EST by a video enabled telemedicine application and verified that I am speaking with the correct person using two identifiers.  Location patient: home Location provider:work or home office Persons participating in the virtual visit: patient, provider  I discussed the limitations of evaluation and management by telemedicine and the availability of in person appointments. The patient expressed understanding and agreed to proceed.  Telemedicine visit is a necessity given the COVID-19 restrictions in place at the current time.  HPI: 24 y/o WF being seen today for 6 mo f/u chronic pain syndrome and GAD. A/P as of last visit: "Chronic pain syndrome. Non-inflammatory polyarthritis (hypermobility). All stable.  VEry reasonable/responsible use of ultracet. She will come by at her earliest convenience for lab visit to do UDS and sign CSC. No new rx's were done today."2  Interim hx:  Feeling fine, happy with her job in [employer].  Indication for chronic opioid: non-inflammatorypolyarthralgia. She saw Dr. Corliss Skains 01/29/18 for rheum 2nd opinion-->dx'd with hypermobility arthralgia. Medication and dose: ultracet 37.5/325, 1-2 bid prn # pills per month: 120-->usually lasts 2 mo or so. Last UDS date: 02/12/18 Opioid Treatment Agreement signed (Y/N): Y, 02/12/18. Opioid Treatment Agreement last reviewed with patient:  today ROS: See pertinent positives and negatives per HPI. PMP AWARE reviewed today: most recent rx for ultracet  was filled 05/03/19, # 120, rx by me. Most recent lorazepam rx was filled 12/25/18, #60, by me. No red flags.  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 Pain doing better, requiring avg of only 1 tramadol per day. Neck and upper back are the main locations. C/o being tried all the time, onset around the time she got on citalopram about a year  ago.  This med has helped signif with depression and anxiety.  Gets 8 ours of sleep daily, although it is a bit broken. She snores and her sleep does not feel restorative.  No witnessed apnea in sleep but she occ/rarely has woken up with a gasp like she couldn't breath in sleep.  No morning HA's.  She works hard at her job all day and does not feel limited by any lack of energy.  NO sexual side effects from the citalopram. She rarely uses lorazepam for anxiety or sleep.  She reporst a several month history of very rare episodes of significant rapid onset of fast heartbeat, gets dizzy at these times, eyes go dim, feels like and like she will pass out.  Passed out once when it occurred In the shower. No episodes of syncope that occurred w/out any warning symptoms.  The episodes last a few minutes at most and seem random, no specific trigger.  No CP.  She denies preceding anxiety.  No otc decong.  She admits to drinking lots of caffeine in the daytime.  She sometimes goes a month w/out having any episode. She wears a wrist monitor and it has registered HRs' of over 200 during these episodes. She has not sought medical attention for this in the past.  ROS: no fevers, no CP, no SOB, no wheezing, no cough, no rashes, no melena/hematochezia.  No polyuria or polydipsia.  NO LE swelling or pain.     Past Medical History:  Diagnosis Date  . Arthralgia of multiple sites    Multiple rheum w/u's neg.  01/2018 rheum eval #2->hypermobility arthralgias, pes planus severe  . Depression   . GAD (generalized  anxiety disorder)     Past Surgical History:  Procedure Laterality Date  . WISDOM TOOTH EXTRACTION      Family History  Problem Relation Age of Onset  . Thyroid disease Mother   . Lupus Father   . Hypertension Father   . Arthritis Maternal Grandmother        osteo  . Diabetes Maternal Grandmother   . Alcohol abuse Maternal Grandfather   . Lupus Paternal Grandmother   . Hyperlipidemia Paternal  Grandmother      Current Outpatient Medications:  .  LORazepam (ATIVAN) 0.5 MG tablet, 1-2 tabs po bid prn anxiety and/or insomnia, Disp: 60 tablet, Rfl: 5 .  SRONYX 0.1-20 MG-MCG tablet, TAKE 1 TABLET BY MOUTH EVERY DAY, Disp: 84 tablet, Rfl: 4 .  traMADol-acetaminophen (ULTRACET) 37.5-325 MG tablet, 1-2 tabs po bid prn pain, Disp: 120 tablet, Rfl: 1 .  FLUoxetine (PROZAC) 20 MG tablet, TAKE 1 TABLET BY MOUTH EVERY DAY, Disp: 90 tablet, Rfl: 2 .  Fluoxetine HCl, PMDD, 20 MG TABS, TAKE 1 TABLET BY MOUTH EVERY DAY (Patient not taking: Reported on 06/29/2019), Disp: 90 tablet, Rfl: 3  EXAM:  VITALS per patient if applicable: There were no vitals taken for this visit.   GENERAL: alert, oriented, appears well and in no acute distress  HEENT: atraumatic, conjunttiva clear, no obvious abnormalities on inspection of external nose and ears  NECK: normal movements of the head and neck  LUNGS: on inspection no signs of respiratory distress, breathing rate appears normal, no obvious gross SOB, gasping or wheezing  CV: no obvious cyanosis  MS: moves all visible extremities without noticeable abnormality  PSYCH/NEURO: pleasant and cooperative, no obvious depression or anxiety, speech and thought processing grossly intact  LABS: none today    Chemistry      Component Value Date/Time   NA 137 12/08/2017 1006   K 4.5 12/08/2017 1006   CL 104 12/08/2017 1006   CO2 29 12/08/2017 1006   BUN 9 12/08/2017 1006   CREATININE 1.02 12/08/2017 1006      Component Value Date/Time   CALCIUM 9.6 12/08/2017 1006   ALKPHOS 67 12/08/2017 1006   AST 15 12/08/2017 1006   ALT 14 12/08/2017 1006   BILITOT 0.5 12/08/2017 1006     Lab Results  Component Value Date   WBC 5.1 11/17/2014   HGB 12.4 11/17/2014   HCT 37.7 11/17/2014   MCV 79.3 11/17/2014   PLT 355.0 11/17/2014    ASSESSMENT AND PLAN:  Discussed the following assessment and plan:  1) Chronic pain; non-inflammatory  arthralgias. Continues to improve on prn ultracet.  Uses sparingly. No new rx needed today.  2 Fatigue: question due to fluoxetine.  Also need to keep OSA in mind.  She has gained some wt recently. We'll have her boyfriend monitor her for any apnea while sleeping. Consider referral for sleep study in the future. At this time, we'll cut her fluoxetine from 20mg  to 10mg  qd and we'll add wellbutrin xl 150 mg qd. Therapeutic expectations and side effect profile of medication discussed today.  Patient's questions answered.  3) TAchypalpitations, with sympoms. Infrequent, no known trigger.  Avoid caffine and alcohol and otc decongestants. She'll find the office/name of a cardiologist in [place] and send me the contact info so I can order referral. Signs/symptoms to call or return or see emergency care for were reviewed and pt expressed understanding.  I discussed the assessment and treatment plan with the patient. The patient was provided an  opportunity to ask questions and all were answered. The patient agreed with the plan and demonstrated an understanding of the instructions.   The patient was advised to call back or seek an in-person evaluation if the symptoms worsen or if the condition fails to improve as anticipated.  F/u: 1 mo  Signed:  Crissie Sickles, MD           06/29/2019

## 2019-07-18 ENCOUNTER — Encounter: Payer: Self-pay | Admitting: Family Medicine

## 2019-07-18 DIAGNOSIS — R002 Palpitations: Secondary | ICD-10-CM

## 2019-07-18 NOTE — Telephone Encounter (Signed)
OK, cardiology referral ordered.

## 2019-07-18 NOTE — Telephone Encounter (Signed)
Pt's last visit was 06/29/19 and advised to call back with cardiology preference.  Please advise, thanks.

## 2019-07-23 ENCOUNTER — Other Ambulatory Visit: Payer: Self-pay | Admitting: Family Medicine

## 2019-07-25 NOTE — Telephone Encounter (Signed)
Requesting: Ultracet Contract:12/08/18 UDS:12/22/18 Last Visit:06/29/19 Next Visit: advised to f/u 81mo. Last Refill:12/24/18(120,1)  Please Advise. Medication pending

## 2019-07-26 NOTE — Telephone Encounter (Signed)
RX sent

## 2019-07-29 ENCOUNTER — Other Ambulatory Visit: Payer: Self-pay | Admitting: Family Medicine

## 2019-08-01 NOTE — Telephone Encounter (Signed)
Patient is scheduled for 1 month f/u GAD on 08/04/19, started on wellbutrin 150mg  during last visit on 06/29/19. Pharmacy requesting 90 day supply.    Last written:06/29/19(30,1)    Please advise, thanks. Medication pending

## 2019-08-02 NOTE — Telephone Encounter (Signed)
Rx sent 

## 2019-08-04 ENCOUNTER — Ambulatory Visit (INDEPENDENT_AMBULATORY_CARE_PROVIDER_SITE_OTHER): Payer: Managed Care, Other (non HMO) | Admitting: Family Medicine

## 2019-08-04 ENCOUNTER — Other Ambulatory Visit: Payer: Self-pay

## 2019-08-04 ENCOUNTER — Encounter: Payer: Self-pay | Admitting: Family Medicine

## 2019-08-04 DIAGNOSIS — R5383 Other fatigue: Secondary | ICD-10-CM | POA: Diagnosis not present

## 2019-08-04 DIAGNOSIS — T50905D Adverse effect of unspecified drugs, medicaments and biological substances, subsequent encounter: Secondary | ICD-10-CM | POA: Diagnosis not present

## 2019-08-04 DIAGNOSIS — F411 Generalized anxiety disorder: Secondary | ICD-10-CM | POA: Diagnosis not present

## 2019-08-04 MED ORDER — FLUOXETINE HCL 10 MG PO TABS: 10.0000 mg | ORAL_TABLET | Freq: Every day | ORAL | 3 refills | Status: DC

## 2019-08-04 NOTE — Progress Notes (Signed)
Virtual Visit via Video Note  I connected with pt on 08/04/19 at  3:30 PM EDT by a video enabled telemedicine application and verified that I am speaking with the correct person using two identifiers.  Location patient: home Location provider:work or home office Persons participating in the virtual visit: patient, provider  I discussed the limitations of evaluation and management by telemedicine and the availability of in person appointments. The patient expressed understanding and agreed to proceed.  Telemedicine visit is a necessity given the COVID-19 restrictions in place at the current time.  HPI: 24 y/o WF being seen today for 1 mo f/u fatigue.  A/P as of last visit: "1) Chronic pain; non-inflammatory arthralgias. Continues to improve on prn ultracet.  Uses sparingly. No new rx needed today.  2 Fatigue: question due to fluoxetine.  Also need to keep OSA in mind.  She has gained some wt recently. We'll have her boyfriend monitor her for any apnea while sleeping. Consider referral for sleep study in the future. At this time, we'll cut her fluoxetine from 20mg  to 10mg  qd and we'll add wellbutrin xl 150 mg qd. Therapeutic expectations and side effect profile of medication discussed today.  Patient's questions answered.  3) TAchypalpitations, with sympoms. Infrequent, no known trigger.  Avoid caffine and alcohol and otc decongestants. She'll find the office/name of a cardiologist in [place] and send me the contact info so I can order referral. Signs/symptoms to call or return or see emergency care for were reviewed and pt expressed understanding."  Interim hx: We got order done for referral to cardiologist near her 07/18/19-->for further eval of her tachypalpitations w/presyncope.  Better!  More energy after work.  No more daytime naps. No side effects from wellbutrin. Mood/anxiety still stable. 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  No new questions. Rare use of lorazepam prn insomnia.  ROS: See pertinent positives and negatives per HPI.  Past Medical History:  Diagnosis Date  . Arthralgia of multiple sites    Multiple rheum w/u's neg.  01/2018 rheum eval #2->hypermobility arthralgias, pes planus severe  . Depression   . GAD (generalized anxiety disorder)     Past Surgical History:  Procedure Laterality Date  . WISDOM TOOTH EXTRACTION      Family History  Problem Relation Age of Onset  . Thyroid disease Mother   . Lupus Father   . Hypertension Father   . Arthritis Maternal Grandmother        osteo  . Diabetes Maternal Grandmother   . Alcohol abuse Maternal Grandfather   . Lupus Paternal Grandmother   . Hyperlipidemia Paternal Grandmother     SOCIAL HX:  Social History   Socioeconomic History  . Marital status: Single    Spouse name: Not on file  . Number of children: Not on file  . Years of education: Not on file  . Highest education level: Not on file  Occupational History  . Not on file  Tobacco Use  . Smoking status: Never Smoker  . Smokeless tobacco: Never Used  Substance and Sexual Activity  . Alcohol use: Yes    Comment: occasional  . Drug use: Not Currently    Types: Marijuana    Comment: occasional  . Sexual activity: Yes    Birth control/protection: Pill  Other Topics Concern  . Not on file  Social History Narrative  . Not on file   Social Determinants of Health   Financial Resource  Strain:   . Difficulty of Paying Living Expenses:   Food Insecurity:   . Worried About Programme researcher, broadcasting/film/video in the Last Year:   . Barista in the Last Year:   Transportation Needs:   . Freight forwarder (Medical):   Marland Kitchen Lack of Transportation (Non-Medical):   Physical Activity:   . Days of Exercise per Week:   . Minutes of Exercise per Session:   Stress:   . Feeling of Stress :   Social Connections:   . Frequency of Communication with Friends and Family:    . Frequency of Social Gatherings with Friends and Family:   . Attends Religious Services:   . Active Member of Clubs or Organizations:   . Attends Banker Meetings:   Marland Kitchen Marital Status:       Current Outpatient Medications:  .  buPROPion (WELLBUTRIN XL) 150 MG 24 hr tablet, TAKE 1 TABLET BY MOUTH EVERY DAY, Disp: 90 tablet, Rfl: 1 .  Fluoxetine HCl, PMDD, 20 MG TABS, 1/2 tab po qd, Disp: 45 tablet, Rfl: 3 .  SRONYX 0.1-20 MG-MCG tablet, TAKE 1 TABLET BY MOUTH EVERY DAY, Disp: 84 tablet, Rfl: 4 .  traMADol-acetaminophen (ULTRACET) 37.5-325 MG tablet, TAKE 1 TO 2 TABLETS BY MOUTH TWICE A DAY AS NEEDED FOR PAIN, Disp: 120 tablet, Rfl: 1 .  FLUoxetine (PROZAC) 10 MG tablet, Take 1 tablet (10 mg total) by mouth daily., Disp: 90 tablet, Rfl: 3 .  LORazepam (ATIVAN) 0.5 MG tablet, 1-2 tabs po bid prn anxiety and/or insomnia (Patient not taking: Reported on 08/04/2019), Disp: 60 tablet, Rfl: 5  EXAM:  VITALS per patient if applicable: There were no vitals taken for this visit.   GENERAL: alert, oriented, appears well and in no acute distress  HEENT: atraumatic, conjunttiva clear, no obvious abnormalities on inspection of external nose and ears  NECK: normal movements of the head and neck  LUNGS: on inspection no signs of respiratory distress, breathing rate appears normal, no obvious gross SOB, gasping or wheezing  CV: no obvious cyanosis  MS: moves all visible extremities without noticeable abnormality  PSYCH/NEURO: pleasant and cooperative, no obvious depression or anxiety, speech and thought processing grossly intact  ASSESSMENT AND PLAN:  Discussed the following assessment and plan:  1) Fatigue: suspect this is side effect from fluoxetine. Improved and she feels good now on lower dose (10mg ) fluox-->continue this and daily wellbutrin xl 150.  2) GAD: stable on fluox 10mg  qd and wellbutrin xl 150mg  qd.     I discussed the assessment and treatment plan with the  patient. The patient was provided an opportunity to ask questions and all were answered. The patient agreed with the plan and demonstrated an understanding of the instructions.   The patient was advised to call back or seek an in-person evaluation if the symptoms worsen or if the condition fails to improve as anticipated.  F/u: 6 mo in person cpe  Signed:  , MD           08/04/2019

## 2019-08-06 ENCOUNTER — Other Ambulatory Visit: Payer: Self-pay | Admitting: Family Medicine

## 2019-09-22 ENCOUNTER — Telehealth: Payer: Managed Care, Other (non HMO) | Admitting: Family Medicine

## 2019-10-07 ENCOUNTER — Telehealth (INDEPENDENT_AMBULATORY_CARE_PROVIDER_SITE_OTHER): Payer: Managed Care, Other (non HMO) | Admitting: Family Medicine

## 2019-10-07 ENCOUNTER — Other Ambulatory Visit: Payer: Self-pay

## 2019-10-07 ENCOUNTER — Encounter: Payer: Self-pay | Admitting: Family Medicine

## 2019-10-07 VITALS — Wt 180.0 lb

## 2019-10-07 DIAGNOSIS — G4733 Obstructive sleep apnea (adult) (pediatric): Secondary | ICD-10-CM

## 2019-10-07 DIAGNOSIS — F411 Generalized anxiety disorder: Secondary | ICD-10-CM | POA: Diagnosis not present

## 2019-10-07 DIAGNOSIS — T50905A Adverse effect of unspecified drugs, medicaments and biological substances, initial encounter: Secondary | ICD-10-CM | POA: Diagnosis not present

## 2019-10-07 DIAGNOSIS — F330 Major depressive disorder, recurrent, mild: Secondary | ICD-10-CM | POA: Diagnosis not present

## 2019-10-07 DIAGNOSIS — G4710 Hypersomnia, unspecified: Secondary | ICD-10-CM

## 2019-10-07 MED ORDER — FLUOXETINE HCL 20 MG PO TABS: 20.0000 mg | ORAL_TABLET | Freq: Every day | ORAL | 3 refills | Status: DC

## 2019-10-07 NOTE — Progress Notes (Signed)
Virtual Visit via Video Note  I connected with pt on 10/07/19 at  2:00 PM EDT by a video enabled telemedicine application and verified that I am speaking with the correct person using two identifiers.  Location patient: home Location provider:work or home office Persons participating in the virtual visit: patient, provider  I discussed the limitations of evaluation and management by telemedicine and the availability of in person appointments. The patient expressed understanding and agreed to proceed.  Telemedicine visit is a necessity given the COVID-19 restrictions in place at the current time.  HPI: 24 y/o WF being seen today for "side effects from bupropion". A/P as of last visit: "1) Fatigue: suspect this is side effect from fluoxetine. Improved and she feels good now on lower dose (10mg ) fluox-->continue this and daily wellbutrin xl 150.  2) GAD: stable on fluox 10mg  qd and wellbutrin xl 150mg  qd."  We added wellbutrin and lowered her fluoxetine dose 3 mo ago and she was improved at f/u 1 mo later---less fatigue and less daytime sleepiness. Anxiety was well controlled.  She was satisfied with the improvement and we made no changes at that time.  INTERIM HX: Wellbutrin made her feel very irritable, snapping at BF a lot,some passive suicidal thoughts.  She initially thought it was stress of her new job.  Stopped med 2 wks ago and all sx's gone. Still taking fluoxetine 10mg  qd.  Mood not as good as when she was on 20mg  fluox.  Vague sensation of not as good a sense of well being. A little more hopeless, a little more isolation. No SI or HI.  Gets 6-9 hours of sleep per night.  Sleep is restorative.  About 6 hours into her day she starts to feel pretty tired, sleepy.  Snores a lot, her BF has witnessed her having periods of apnea in sleep.  Moves legs a lot during sleep.  She saw a cardiologist, they got info off her apple watch, she says she was dx'd with PSVT and there is plan for  catheter ablation in near future. No meds were rx'd.  She started allergy immunotherapy for about 30 allergens.   ROS: See pertinent positives and negatives per HPI.  Past Medical History:  Diagnosis Date  . Arthralgia of multiple sites    Multiple rheum w/u's neg.  01/2018 rheum eval #2->hypermobility arthralgias, pes planus severe  . Depression   . GAD (generalized anxiety disorder)     Past Surgical History:  Procedure Laterality Date  . WISDOM TOOTH EXTRACTION      Family History  Problem Relation Age of Onset  . Thyroid disease Mother   . Lupus Father   . Hypertension Father   . Arthritis Maternal Grandmother        osteo  . Diabetes Maternal Grandmother   . Alcohol abuse Maternal Grandfather   . Lupus Paternal Grandmother   . Hyperlipidemia Paternal Grandmother      Current Outpatient Medications:  .  FLUoxetine (PROZAC) 10 MG tablet, Take 1 tablet (10 mg total) by mouth daily., Disp: 90 tablet, Rfl: 3 .  LORazepam (ATIVAN) 0.5 MG tablet, 1-2 tabs po bid prn anxiety and/or insomnia, Disp: 60 tablet, Rfl: 5 .  SRONYX 0.1-20 MG-MCG tablet, TAKE 1 TABLET BY MOUTH EVERY DAY, Disp: 84 tablet, Rfl: 4 .  traMADol-acetaminophen (ULTRACET) 37.5-325 MG tablet, TAKE 1 TO 2 TABLETS BY MOUTH TWICE A DAY AS NEEDED FOR PAIN, Disp: 120 tablet, Rfl: 1  EXAM:  VITALS per patient if applicable:  GENERAL: alert, oriented, appears well and in no acute distress  HEENT: atraumatic, conjunttiva clear, no obvious abnormalities on inspection of external nose and ears  NECK: normal movements of the head and neck  LUNGS: on inspection no signs of respiratory distress, breathing rate appears normal, no obvious gross SOB, gasping or wheezing  CV: no obvious cyanosis  MS: moves all visible extremities without noticeable abnormality  PSYCH/NEURO: pleasant and cooperative, no obvious depression or anxiety, speech and thought processing grossly intact  LABS: none today   Chemistry       Component Value Date/Time   NA 137 12/08/2017 1006   K 4.5 12/08/2017 1006   CL 104 12/08/2017 1006   CO2 29 12/08/2017 1006   BUN 9 12/08/2017 1006   CREATININE 1.02 12/08/2017 1006      Component Value Date/Time   CALCIUM 9.6 12/08/2017 1006   ALKPHOS 67 12/08/2017 1006   AST 15 12/08/2017 1006   ALT 14 12/08/2017 1006   BILITOT 0.5 12/08/2017 1006     Lab Results  Component Value Date   TSH 1.87 01/23/2014   Lab Results  Component Value Date   WBC 5.1 11/17/2014   HGB 12.4 11/17/2014   HCT 37.7 11/17/2014   MCV 79.3 11/17/2014   PLT 355.0 11/17/2014    ASSESSMENT AND PLAN:  Discussed the following assessment and plan:  1) GAD, depression: worse on wellbutrin. Since stopping wellbutrin her side effects/worsening has resolved. She is ok with going back up to fluoxetine 20mg  qd since her excessive fatigue/daytime sleepiness is likely from her undiagnosed/untreated OSA.  2) OSA suspected.  She has excessive daytime sleepiness/fatigue. +Witnessed apnea in sleep. Refer to pulm/sleep MD at pulm clinic in her hometown of [place].  3) Tachyarrhythmia: sounds like they dx'd her with PSVT in [place]. Plan for catheter ablation per pt report.  4) Allergic rhinitis (allergies to about 30 of 50 things tested)---getting allergy immunotherapy now.   I discussed the assessment and treatment plan with the patient. The patient was provided an opportunity to ask questions and all were answered. The patient agreed with the plan and demonstrated an understanding of the instructions.   The patient was advised to call back or seek an in-person evaluation if the symptoms worsen or if the condition fails to improve as anticipated.  F/u: 6 wks  Signed:  Crissie Sickles, MD           10/07/2019

## 2019-11-25 ENCOUNTER — Encounter: Payer: Self-pay | Admitting: Family Medicine

## 2019-11-27 DIAGNOSIS — E8881 Metabolic syndrome: Secondary | ICD-10-CM

## 2019-11-27 DIAGNOSIS — E88819 Insulin resistance, unspecified: Secondary | ICD-10-CM

## 2019-11-27 HISTORY — DX: Metabolic syndrome: E88.81

## 2019-11-27 HISTORY — DX: Insulin resistance, unspecified: E88.819

## 2019-12-01 ENCOUNTER — Telehealth (INDEPENDENT_AMBULATORY_CARE_PROVIDER_SITE_OTHER): Payer: Managed Care, Other (non HMO) | Admitting: Family Medicine

## 2019-12-01 ENCOUNTER — Encounter: Payer: Self-pay | Admitting: Family Medicine

## 2019-12-01 VITALS — Ht 62.0 in | Wt 195.0 lb

## 2019-12-01 DIAGNOSIS — F3342 Major depressive disorder, recurrent, in full remission: Secondary | ICD-10-CM

## 2019-12-01 DIAGNOSIS — R635 Abnormal weight gain: Secondary | ICD-10-CM | POA: Diagnosis not present

## 2019-12-01 DIAGNOSIS — F411 Generalized anxiety disorder: Secondary | ICD-10-CM | POA: Diagnosis not present

## 2019-12-01 DIAGNOSIS — G894 Chronic pain syndrome: Secondary | ICD-10-CM | POA: Diagnosis not present

## 2019-12-01 DIAGNOSIS — Z79899 Other long term (current) drug therapy: Secondary | ICD-10-CM

## 2019-12-01 DIAGNOSIS — M2550 Pain in unspecified joint: Secondary | ICD-10-CM

## 2019-12-01 MED ORDER — FLUOXETINE HCL 20 MG PO TABS: 20.0000 mg | ORAL_TABLET | Freq: Every day | ORAL | 3 refills | Status: DC

## 2019-12-01 MED ORDER — TRAMADOL-ACETAMINOPHEN 37.5-325 MG PO TABS: ORAL_TABLET | ORAL | 1 refills | Status: DC

## 2019-12-01 NOTE — Progress Notes (Signed)
Virtual Visit via Video Note  I connected with Tina Ortiz on 12/01/19 at  4:00 PM EDT by a video enabled telemedicine application and verified that I am speaking with the correct person using two identifiers.  Location patient: home Location provider:work or home office Persons participating in the virtual visit: patient, provider  I discussed the limitations of evaluation and management by telemedicine and the availability of in person appointments. The patient expressed understanding and agreed to proceed.   HPI: 24 y/o WF being seen today for 2 mo f/u GAD and depression as well as chronic pain from non-inflammatory arthralgias. A/P as of last visit: "1) GAD, depression: worse on wellbutrin. Since stopping wellbutrin her side effects/worsening has resolved. She is ok with going back up to fluoxetine 20mg  qd since her excessive fatigue/daytime sleepiness is likely from her undiagnosed/untreated OSA.  2) OSA suspected.  She has excessive daytime sleepiness/fatigue. +Witnessed apnea in sleep. Refer to pulm/sleep MD at pulm clinic in her hometown of [place].  3) Tachyarrhythmia: sounds like they dx'd her with PSVT in [place]. Plan for catheter ablation per Tina Ortiz report.  4) Allergic rhinitis (allergies to about 30 of 50 things tested)---getting allergy immunotherapy now."  INTERIM HX: Feeling good on fluoxetine: mood improved and anxiety stable. She rarely uses lorazepam.  Joint pains have been a bit worse lately.  OK during the day.  When sitting still too long and when lying down at night it gets worse.  Ultracet at bedtime helps sufficiently. Not taking any otc pain med.  Ablation went well. No sx's at all now. She is set to get a home sleep study but also wonders about getting thyroid checked since she is tired a lot.  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 Pain has historically been  well controlled with judicious use of tramadol/apap PMP AWARE reviewed today: most recent rx for ultracet was filled 07/25/19, # 120, rx by me. No red flags.   ROS: See pertinent positives and negatives per HPI.  Past Medical History:  Diagnosis Date  . Allergic rhinitis    allergy immunotherapy as of 2021  . Arthralgia of multiple sites    Multiple rheum w/u's neg.  01/2018 rheum eval #2->hypermobility arthralgias, pes planus severe  . Depression   . GAD (generalized anxiety disorder)   . PSVT (paroxysmal supraventricular tachycardia) (HCC) 2021   Dx'd by cardiologist in [place]-->plan for catheter ablation as of 09/2019    Past Surgical History:  Procedure Laterality Date  . WISDOM TOOTH EXTRACTION      Family History  Problem Relation Age of Onset  . Thyroid disease Mother   . Lupus Father   . Hypertension Father   . Arthritis Maternal Grandmother        osteo  . Diabetes Maternal Grandmother   . Alcohol abuse Maternal Grandfather   . Lupus Paternal Grandmother   . Hyperlipidemia Paternal Grandmother      Current Outpatient Medications:  .  FLUoxetine (PROZAC) 20 MG tablet, Take 1 tablet (20 mg total) by mouth daily., Disp: 30 tablet, Rfl: 3 .  levocetirizine (XYZAL) 5 MG tablet, Take 5 mg by mouth daily., Disp: , Rfl:  .  LORazepam (ATIVAN) 0.5 MG tablet, 1-2 tabs po bid prn anxiety and/or insomnia, Disp: 60 tablet, Rfl: 5 .  SRONYX 0.1-20 MG-MCG tablet, TAKE 1 TABLET BY MOUTH EVERY DAY, Disp: 84 tablet, Rfl: 4 .  traMADol-acetaminophen (ULTRACET) 37.5-325  MG tablet, TAKE 1 TO 2 TABLETS BY MOUTH TWICE A DAY AS NEEDED FOR PAIN, Disp: 120 tablet, Rfl: 1  EXAM:  VITALS per patient if applicable: Ht 5\' 2"  (1.575 m)   Wt 195 lb (88.5 kg)   LMP 11/10/2019   BMI 35.67 kg/m    GENERAL: alert, oriented, appears well and in no acute distress  HEENT: atraumatic, conjunttiva clear, no obvious abnormalities on inspection of external nose and ears  NECK: normal  movements of the head and neck  LUNGS: on inspection no signs of respiratory distress, breathing rate appears normal, no obvious gross SOB, gasping or wheezing  CV: no obvious cyanosis  MS: moves all visible extremities without noticeable abnormality  PSYCH/NEURO: pleasant and cooperative, no obvious depression or anxiety, speech and thought processing grossly intact  ASSESSMENT AND PLAN:  Discussed the following assessment and plan:  Abnormal weight gain - Plan: TSH, Lipid panel, Comprehensive metabolic panel, T3, T4, free, Hemoglobin A1c, Insulin and C-Peptide  GAD (generalized anxiety disorder) - Plan: DRUG MONITORING, PANEL 8 WITH CONFIRMATION, URINE  MDD (major depressive disorder), recurrent, in full remission (HCC)  Chronic pain syndrome - Plan: DRUG MONITORING, PANEL 8 WITH CONFIRMATION, URINE  Arthralgia, unspecified joint - Plan: DRUG MONITORING, PANEL 8 WITH CONFIRMATION, URINE  High risk medication use - Plan: DRUG MONITORING, PANEL 8 WITH CONFIRMATION, URINE  No changes in any meds today. New rx for ultracet done today--see orders. RFs for fluoxetine 20mg  qd. Schedule future lab visit--orders as above. CSC and UDS renewal at lab visit.  -we discussed possible serious and likely etiologies, options for evaluation and workup, limitations of telemedicine visit vs in person visit, treatment, treatment risks and precautions. Tina Ortiz prefers to treat via telemedicine empirically rather then risking or undertaking an in person visit at this moment. Patient agrees to seek prompt in person care if worsening, new symptoms arise, or if is not improving with treatment.   I discussed the assessment and treatment plan with the patient. The patient was provided an opportunity to ask questions and all were answered. The patient agreed with the plan and demonstrated an understanding of the instructions.   The patient was advised to call back or seek an in-person evaluation if the symptoms  worsen or if the condition fails to improve as anticipated.  F/u: 3 mo  Signed:  11/12/2019, MD           12/01/2019

## 2019-12-09 ENCOUNTER — Ambulatory Visit (INDEPENDENT_AMBULATORY_CARE_PROVIDER_SITE_OTHER): Payer: Managed Care, Other (non HMO) | Admitting: Family Medicine

## 2019-12-09 ENCOUNTER — Other Ambulatory Visit: Payer: Self-pay

## 2019-12-09 DIAGNOSIS — Z79899 Other long term (current) drug therapy: Secondary | ICD-10-CM

## 2019-12-09 DIAGNOSIS — F411 Generalized anxiety disorder: Secondary | ICD-10-CM

## 2019-12-09 DIAGNOSIS — R635 Abnormal weight gain: Secondary | ICD-10-CM | POA: Diagnosis not present

## 2019-12-09 DIAGNOSIS — M2550 Pain in unspecified joint: Secondary | ICD-10-CM

## 2019-12-09 DIAGNOSIS — G894 Chronic pain syndrome: Secondary | ICD-10-CM

## 2019-12-09 NOTE — Addendum Note (Signed)
Addended by: Nakeshia Waldeck D on: 12/09/2019 01:18 PM   Modules accepted: Orders  

## 2019-12-09 NOTE — Addendum Note (Signed)
Addended by: Jonay Hitchcock D on: 12/09/2019 01:18 PM   Modules accepted: Orders  

## 2019-12-09 NOTE — Addendum Note (Signed)
Addended by: Micky Sheller D on: 12/09/2019 01:18 PM   Modules accepted: Orders  

## 2019-12-09 NOTE — Addendum Note (Signed)
Addended by: Reymundo Winship D on: 12/09/2019 01:18 PM   Modules accepted: Orders  

## 2019-12-09 NOTE — Addendum Note (Signed)
Addended by: Emi Holes D on: 12/09/2019 01:18 PM   Modules accepted: Orders

## 2019-12-10 LAB — COMPREHENSIVE METABOLIC PANEL
AG Ratio: 1.5 (calc) (ref 1.0–2.5)
ALT: 26 U/L (ref 6–29)
AST: 19 U/L (ref 10–30)
Albumin: 4.2 g/dL (ref 3.6–5.1)
Alkaline phosphatase (APISO): 93 U/L (ref 31–125)
BUN: 8 mg/dL (ref 7–25)
CO2: 25 mmol/L (ref 20–32)
Calcium: 9.6 mg/dL (ref 8.6–10.2)
Chloride: 104 mmol/L (ref 98–110)
Creat: 0.89 mg/dL (ref 0.50–1.10)
Globulin: 2.8 g/dL (calc) (ref 1.9–3.7)
Glucose, Bld: 92 mg/dL (ref 65–99)
Potassium: 4.4 mmol/L (ref 3.5–5.3)
Sodium: 138 mmol/L (ref 135–146)
Total Bilirubin: 0.2 mg/dL (ref 0.2–1.2)
Total Protein: 7 g/dL (ref 6.1–8.1)

## 2019-12-10 LAB — DRUG MONITORING, PANEL 8 WITH CONFIRMATION, URINE
6 Acetylmorphine: NEGATIVE ng/mL (ref <10)
Alcohol Metabolites: NEGATIVE ng/mL
Amphetamines: NEGATIVE ng/mL (ref <500)
Benzodiazepines: NEGATIVE ng/mL (ref <100)
Buprenorphine, Urine: NEGATIVE ng/mL (ref <5)
Cocaine Metabolite: NEGATIVE ng/mL (ref <150)
Creatinine: 168.9 mg/dL
MDMA: NEGATIVE ng/mL (ref <500)
Marijuana Metabolite: NEGATIVE ng/mL (ref <20)
Opiates: NEGATIVE ng/mL (ref <100)
Oxidant: NEGATIVE ug/mL
Oxycodone: NEGATIVE ng/mL (ref <100)
pH: 5.6 (ref 4.5–9.0)

## 2019-12-10 LAB — DM TEMPLATE

## 2019-12-10 LAB — LIPID PANEL
Cholesterol: 199 mg/dL (ref <200)
HDL: 50 mg/dL (ref >=50)
LDL Cholesterol (Calc): 124 mg/dL (calc) — ABNORMAL HIGH
Non-HDL Cholesterol (Calc): 149 mg/dL (calc) — ABNORMAL HIGH (ref <130)
Total CHOL/HDL Ratio: 4 (calc) (ref <5.0)
Triglycerides: 130 mg/dL (ref <150)

## 2019-12-10 LAB — HEMOGLOBIN A1C
Hgb A1c MFr Bld: 5.6 % of total Hgb (ref <5.7)
Mean Plasma Glucose: 114 (calc)
eAG (mmol/L): 6.3 (calc)

## 2019-12-10 LAB — TSH: TSH: 3.56 mIU/L

## 2019-12-10 LAB — T3: T3, Total: 157 ng/dL (ref 76–181)

## 2019-12-10 LAB — INSULIN AND C-PEPTIDE, SERUM
C-Peptide: 6.6 ng/mL — ABNORMAL HIGH (ref 1.1–4.4)
INSULIN: 43.4 u[IU]/mL — ABNORMAL HIGH (ref 2.6–24.9)

## 2019-12-10 LAB — T4, FREE: Free T4: 1.2 ng/dL (ref 0.8–1.8)

## 2020-01-03 IMAGING — DX DG HAND COMPLETE 3+V*L*
3 series · 3 of 3 positions shown · non-contrast
Comparison: None.

CLINICAL DATA: Chronic left hand pain.

EXAM:
LEFT HAND - COMPLETE 3+ VIEW

[hand pa]
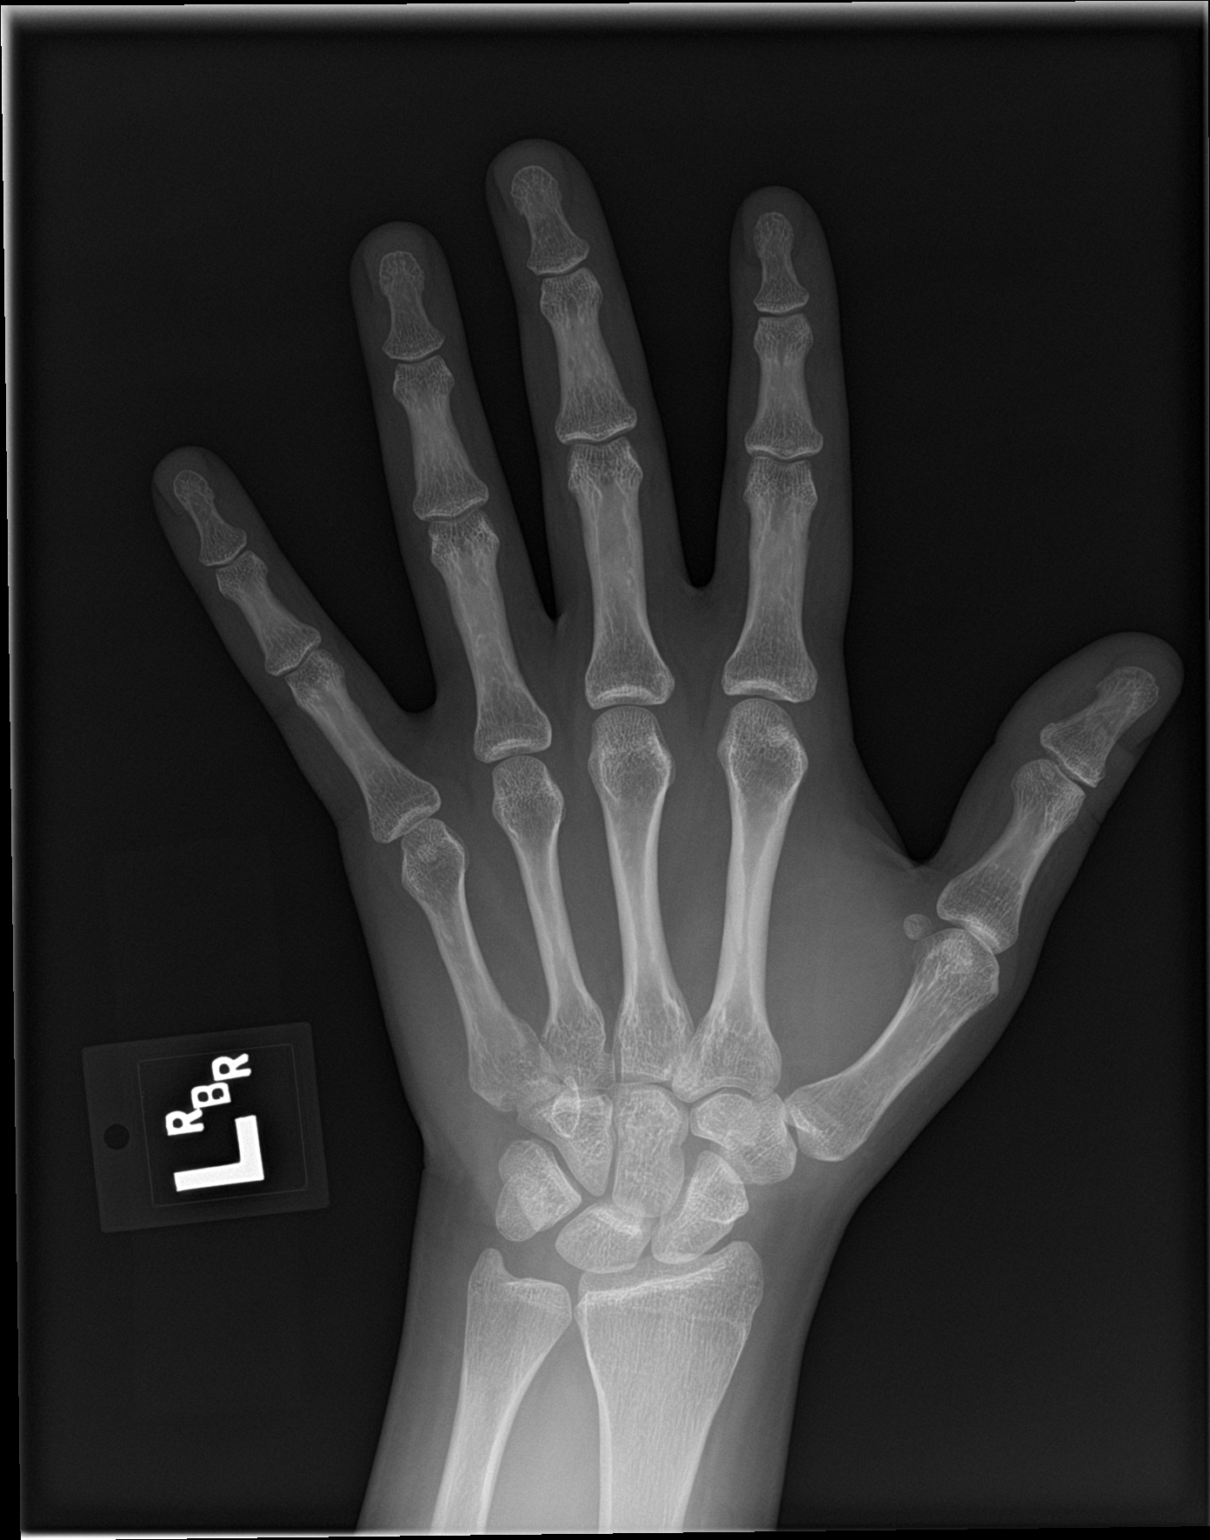

[hand obl]
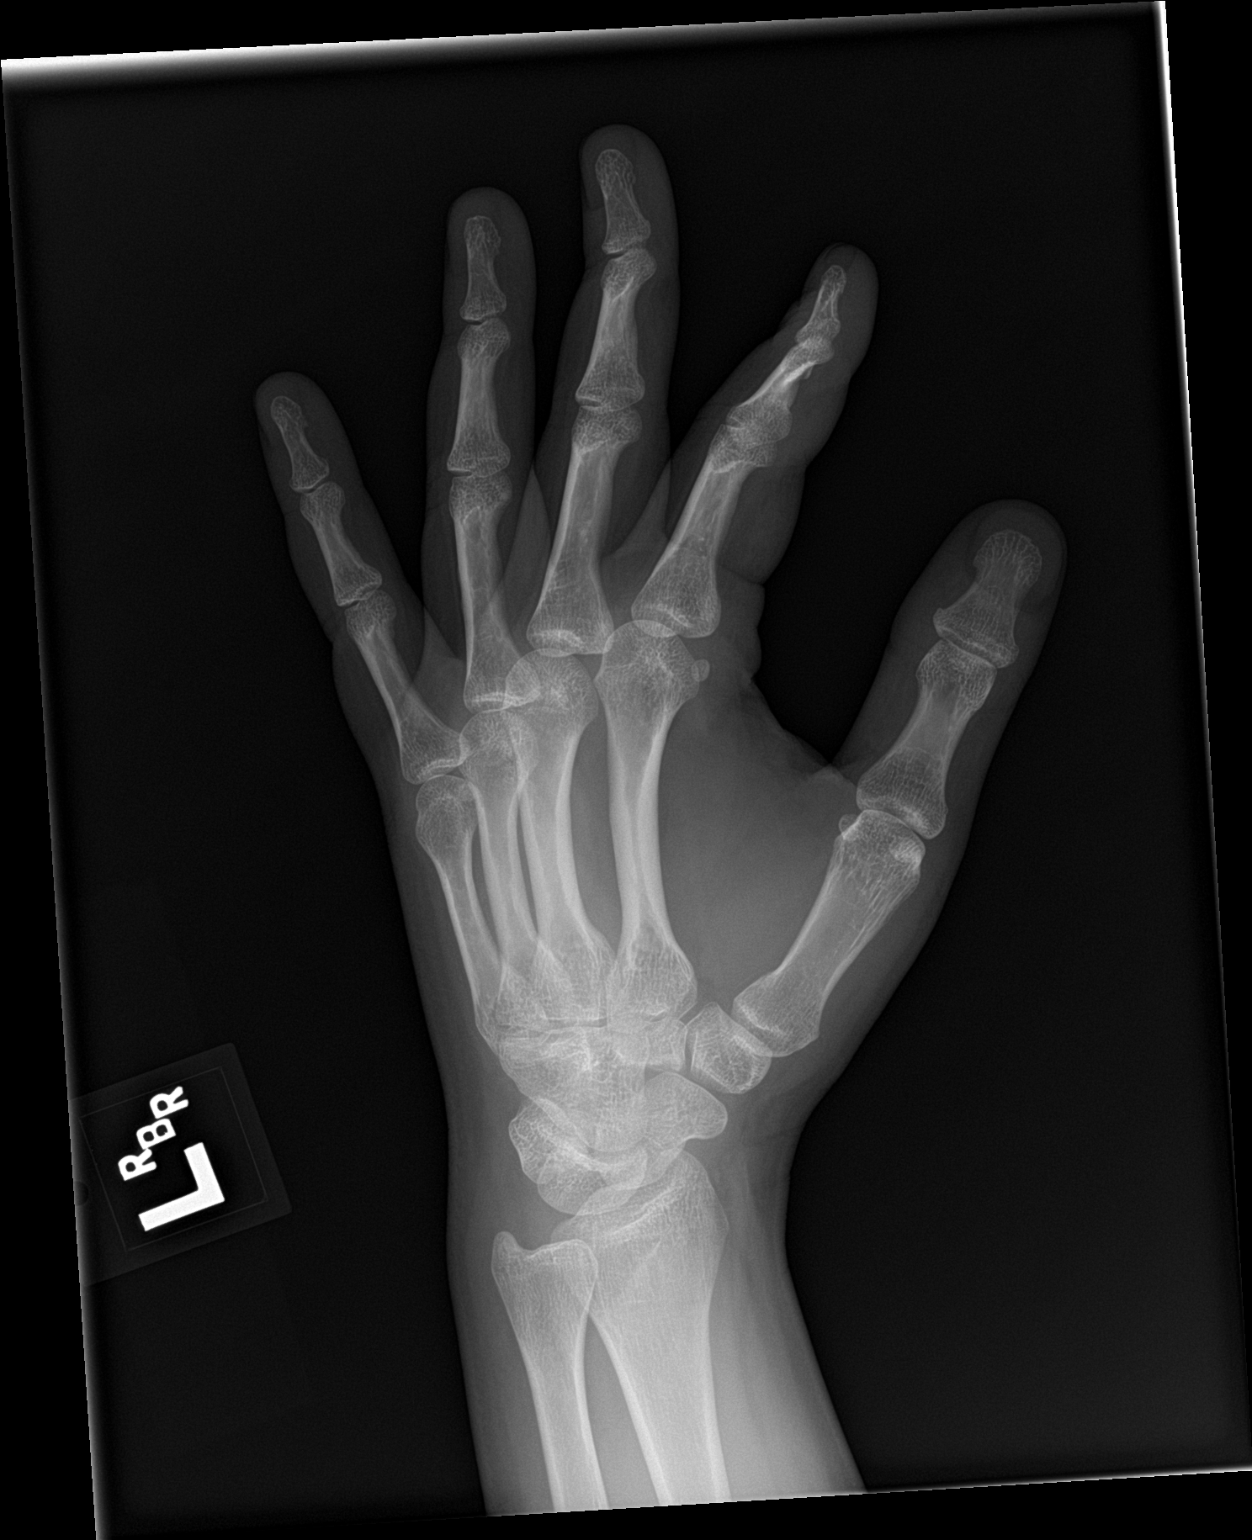

[hand lat]
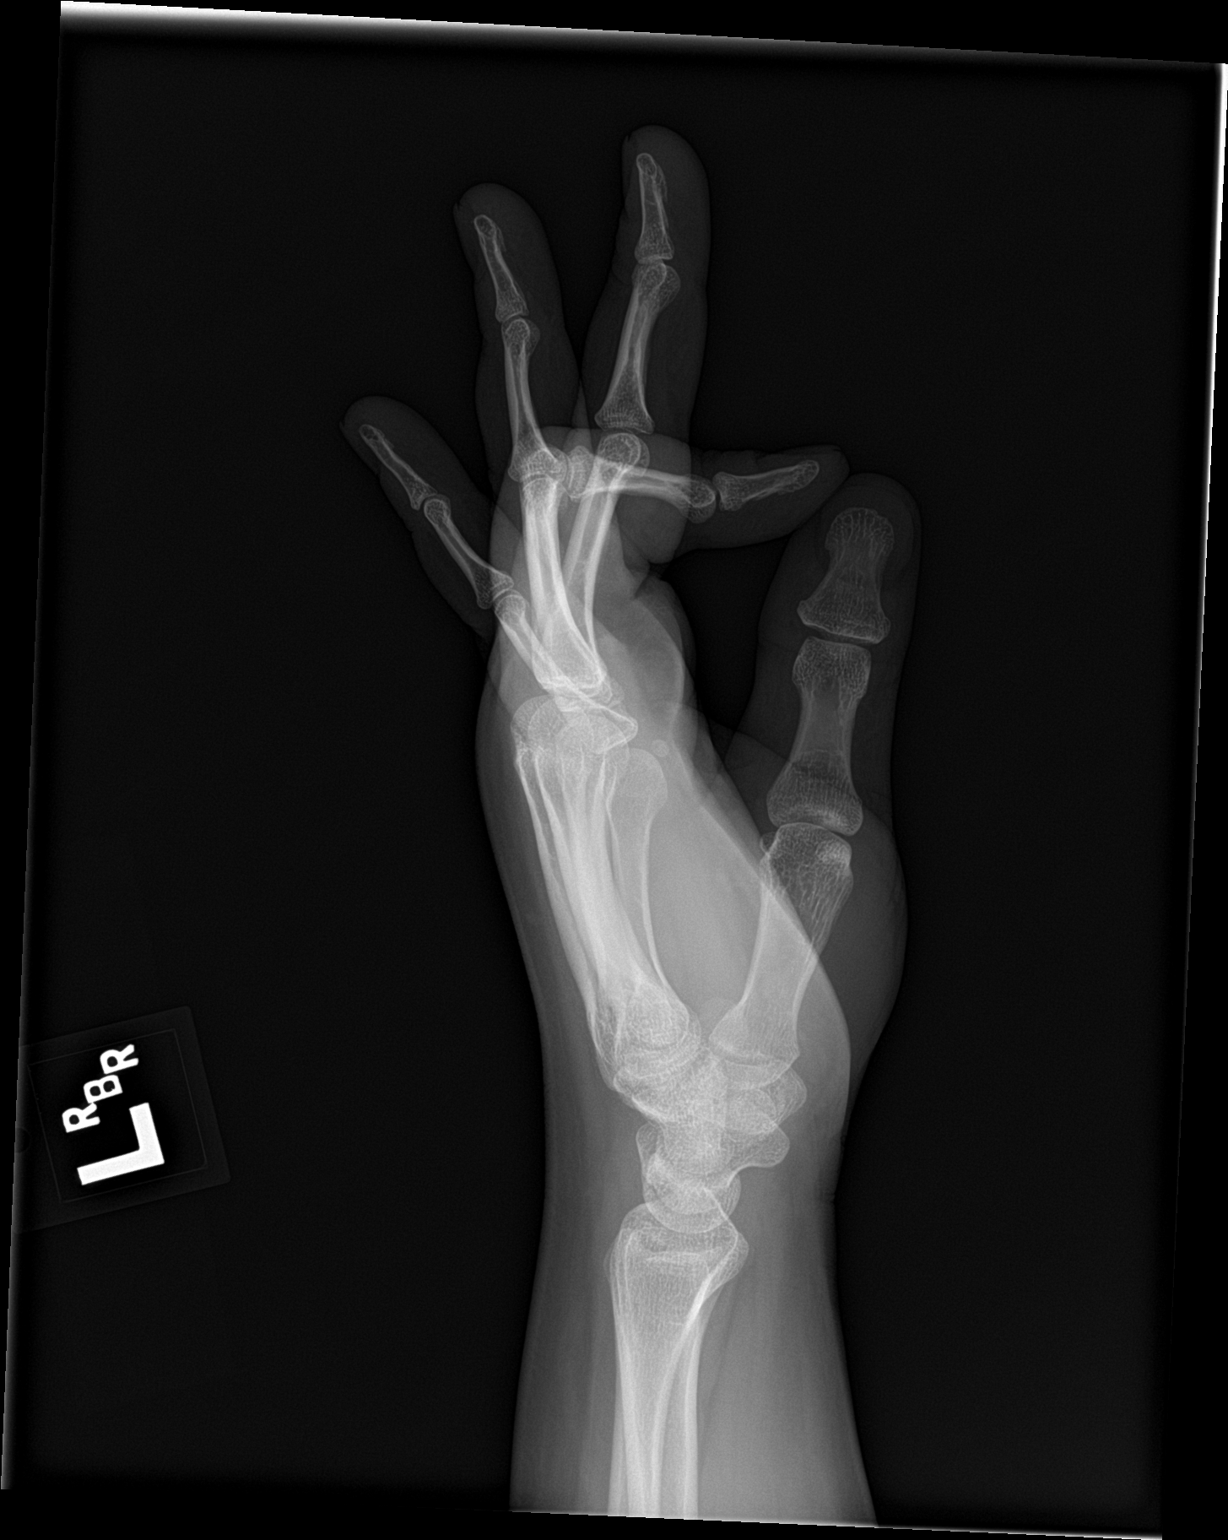

[3 of 3 positions shown; findings below may reference images not displayed]

FINDINGS: There is no evidence of fracture or dislocation. There is no
evidence of arthropathy or other focal bone abnormality. Soft
tissues are unremarkable.
IMPRESSION: Normal left hand.

## 2020-01-18 DIAGNOSIS — I471 Supraventricular tachycardia: Secondary | ICD-10-CM | POA: Insufficient documentation

## 2020-01-19 DIAGNOSIS — G4730 Sleep apnea, unspecified: Secondary | ICD-10-CM | POA: Insufficient documentation

## 2020-02-09 ENCOUNTER — Encounter: Payer: Self-pay | Admitting: Family Medicine

## 2020-02-09 NOTE — Telephone Encounter (Signed)
Please advise, thanks.

## 2020-05-20 ENCOUNTER — Other Ambulatory Visit: Payer: Self-pay | Admitting: Family Medicine

## 2020-05-22 NOTE — Telephone Encounter (Signed)
LM for pt to return call to verify correct pharmacy.

## 2020-05-23 NOTE — Telephone Encounter (Signed)
Requesting: ultracet Contract: 12/09/19 UDS:12/09/19 Last Visit:12/01/19 Next Visit:06/08/20 Last Refill:12/01/19(120,1)  Please Advise. Medication pending

## 2020-05-23 NOTE — Telephone Encounter (Signed)
Patient returned call and provided pharmacy info: [facility] at [address] [place], [place], [place]. Phone [phone].

## 2020-05-24 NOTE — Telephone Encounter (Signed)
Patient made aware refill sent.  

## 2020-06-08 ENCOUNTER — Telehealth (INDEPENDENT_AMBULATORY_CARE_PROVIDER_SITE_OTHER): Payer: Managed Care, Other (non HMO) | Admitting: Family Medicine

## 2020-06-08 ENCOUNTER — Encounter: Payer: Self-pay | Admitting: Family Medicine

## 2020-06-08 DIAGNOSIS — G894 Chronic pain syndrome: Secondary | ICD-10-CM | POA: Diagnosis not present

## 2020-06-08 DIAGNOSIS — F330 Major depressive disorder, recurrent, mild: Secondary | ICD-10-CM | POA: Diagnosis not present

## 2020-06-08 DIAGNOSIS — F411 Generalized anxiety disorder: Secondary | ICD-10-CM

## 2020-06-08 MED ORDER — FLUOXETINE HCL 40 MG PO CAPS: 40.0000 mg | ORAL_CAPSULE | Freq: Every day | ORAL | 0 refills | Status: DC

## 2020-06-08 NOTE — Progress Notes (Signed)
Virtual Visit via Video Note  I connected with Tina Ortiz on 06/08/20 at  4:00 PM EST by a video enabled telemedicine application and verified that I am speaking with the correct person using two identifiers.  Location patient: home, [place] Location provider:work or home office Persons participating in the virtual visit: patient, provider  I discussed the limitations of evaluation and management by telemedicine and the availability of in person appointments. The patient expressed understanding and agreed to proceed.  HPI: 25 y/o WF being seen today for 6 mo f/u chronic polyarthralgia syndrome/pain as well as chronic anxiety/depression. No changes were made at the time of last f/u--labs done shortly after that visit were all good except insulin level elevated and C-peptide level elevated Prediabetes guidelines were given to patient.  She got dx'd with OSA since I last saw her, is feeling improved on CPAP.  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 Pain has historically been well controlled with judicious use of tramadol/apap. This remains the case, says she uses 2-3 tabs on some days but also some days none at all. No joint swelling or redness.  Anx/mood: anxiety level is stable, RARELY has to take lorazepam. However, mood is more consistently down/depressed the last few months.  Poor motivation, anhedonia, dec energy, beats herself up.  No HI or SI. She is set up to start counseling.  PMP AWARE reviewed today: most recent rx for ultracet 37.5-325 was filled 05/23/20, # 120, rx by me. Most recent lorazepam 0.5mg  rx filled 12/24/18, #60, rx by me. No red flags.   ROS: See pertinent positives and negatives per HPI.  Past Medical History:  Diagnosis Date  . Allergic rhinitis    allergy immunotherapy as of 2021  . Arthralgia of multiple sites    Multiple rheum w/u's neg.  01/2018 rheum eval #2->hypermobility  arthralgias, pes planus severe  . Depression   . GAD (generalized anxiety disorder)   . PSVT (paroxysmal supraventricular tachycardia) (HCC) 2021   Dx'd by cardiologist in [place]-->plan for catheter ablation as of 09/2019    Past Surgical History:  Procedure Laterality Date  . WISDOM TOOTH EXTRACTION       Current Outpatient Medications:  .  traMADol-acetaminophen (ULTRACET) 37.5-325 MG tablet, TAKE 1 TO 2 TABLETS BY MOUTH TWICE A DAY AS NEEDED FOR PAIN, Disp: 120 tablet, Rfl: 1 .  FLUoxetine (PROZAC) 20 MG tablet, Take 1 tablet (20 mg total) by mouth daily., Disp: 90 tablet, Rfl: 3 .  levocetirizine (XYZAL) 5 MG tablet, Take 5 mg by mouth daily., Disp: , Rfl:  .  LORazepam (ATIVAN) 0.5 MG tablet, 1-2 tabs po bid prn anxiety and/or insomnia, Disp: 60 tablet, Rfl: 5 .  SRONYX 0.1-20 MG-MCG tablet, TAKE 1 TABLET BY MOUTH EVERY DAY, Disp: 84 tablet, Rfl: 4  EXAM:  VITALS per patient if applicable:  Vitals with BMI 12/01/2019 10/07/2019 12/08/2018  Height 5\' 2"  - 5\' 2"   Weight 195 lbs 180 lbs -  BMI 35.66 - -  Systolic - (No Data) -  Diastolic - (No Data) -  Pulse - (No Data) -    GENERAL: alert, oriented, appears well and in no acute distress  HEENT: atraumatic, conjunttiva clear, no obvious abnormalities on inspection of external nose and ears  NECK: normal movements of the head and neck  LUNGS: on inspection no signs of respiratory distress, breathing rate appears normal, no obvious gross SOB, gasping or wheezing  CV: no obvious cyanosis  MS: moves all visible extremities without noticeable abnormality  PSYCH/NEURO: pleasant and cooperative, no obvious depression or anxiety, speech and thought processing grossly intact  LABS: none today  Lab Results  Component Value Date   TSH 3.56 12/09/2019     Chemistry      Component Value Date/Time   NA 138 12/09/2019 1527   K 4.4 12/09/2019 1527   CL 104 12/09/2019 1527   CO2 25 12/09/2019 1527   BUN 8 12/09/2019 1527    CREATININE 0.89 12/09/2019 1527      Component Value Date/Time   CALCIUM 9.6 12/09/2019 1527   ALKPHOS 67 12/08/2017 1006   AST 19 12/09/2019 1527   ALT 26 12/09/2019 1527   BILITOT 0.2 12/09/2019 1527     Lab Results  Component Value Date   WBC 5.1 11/17/2014   HGB 12.4 11/17/2014   HCT 37.7 11/17/2014   MCV 79.3 11/17/2014   PLT 355.0 11/17/2014   Lab Results  Component Value Date   HGBA1C 5.6 12/09/2019    ASSESSMENT AND PLAN:  Discussed the following assessment and plan:  1) MDD, mild/mod episode. Increase fluoxetine to 40mg  qd. Therapeutic expectations and side effect profile of medication discussed today.  Patient's questions answered.  2) GAD, stable.  Rare need for lorazepam.  No new rx for this med was needed today.  3) Chronic pain syndrome. Joint hypermobility polyarthralgia (non-inflammatory). Stable. Uses ultracet judiciously, CSC and UDS are UTD. No new rx for this med was needed today.  4) Insulin resistance:  Plan repeat labs around 11/2020.   5) OSA: doing better since getting on CPAP.  I discussed the assessment and treatment plan with the patient. The patient was provided an opportunity to ask questions and all were answered. The patient agreed with the plan and demonstrated an understanding of the instructions.  F/u: 1 mo   Signed:  12/2020, MD           06/08/2020

## 2020-07-06 ENCOUNTER — Other Ambulatory Visit: Payer: Self-pay | Admitting: Family Medicine

## 2020-07-10 ENCOUNTER — Telehealth: Payer: Self-pay

## 2020-07-10 NOTE — Telephone Encounter (Signed)
Pt was last seen 06/08/20 as virtual appt, advised to return in about 4 weeks (around 07/06/2020) for f/u depression.   Please advise if ok for 3/24 appt to be virtual

## 2020-07-10 NOTE — Telephone Encounter (Signed)
Yes virtual okay. 

## 2020-07-10 NOTE — Telephone Encounter (Signed)
Pt advised ok for change to virtual, appt changed.

## 2020-07-10 NOTE — Telephone Encounter (Signed)
Patient called to schedule follow up appt with Dr. Milinda Cave.  She requested this visit to be virtual.  Appt scheduled on 3/24.  Can this appt be virtual?    No follow up call needs to be made to patient at this time.  Thank you, Annabelle Harman

## 2020-07-19 ENCOUNTER — Encounter: Payer: Self-pay | Admitting: Family Medicine

## 2020-07-19 ENCOUNTER — Telehealth (INDEPENDENT_AMBULATORY_CARE_PROVIDER_SITE_OTHER): Payer: PRIVATE HEALTH INSURANCE | Admitting: Family Medicine

## 2020-07-19 DIAGNOSIS — F3342 Major depressive disorder, recurrent, in full remission: Secondary | ICD-10-CM

## 2020-07-19 DIAGNOSIS — F5105 Insomnia due to other mental disorder: Secondary | ICD-10-CM | POA: Diagnosis not present

## 2020-07-19 DIAGNOSIS — F99 Mental disorder, not otherwise specified: Secondary | ICD-10-CM | POA: Diagnosis not present

## 2020-07-19 DIAGNOSIS — F411 Generalized anxiety disorder: Secondary | ICD-10-CM | POA: Diagnosis not present

## 2020-07-19 MED ORDER — LORAZEPAM 0.5 MG PO TABS: ORAL_TABLET | ORAL | 1 refills | Status: AC

## 2020-07-19 MED ORDER — FLUOXETINE HCL 40 MG PO CAPS: 40.0000 mg | ORAL_CAPSULE | Freq: Every day | ORAL | 3 refills | Status: DC

## 2020-07-19 NOTE — Progress Notes (Signed)
Virtual Visit via Video Note  I connected with pt on 07/19/20 at  2:00 PM EDT by a video enabled telemedicine application and verified that I am speaking with the correct person using two identifiers.  Location patient: home, [place] Location provider:work or home office Persons participating in the virtual visit: patient, provider  I discussed the limitations of evaluation and management by telemedicine and the availability of in person appointments. The patient expressed understanding and agreed to proceed.   HPI: 25 y/o WF being seen today for 5 week f/u recurrent MDD. A/P as of last visit: "1) MDD, mild/mod episode. Increase fluoxetine to 40mg  qd. Therapeutic expectations and side effect profile of medication discussed today.  Patient's questions answered.  2) GAD, stable.  Rare need for lorazepam.  No new rx for this med was needed today.  3) Chronic pain syndrome. Joint hypermobility polyarthralgia (non-inflammatory). Stable. Uses ultracet judiciously, CSC and UDS are UTD. No new rx for this med was needed today.  4) Insulin resistance:  Plan repeat labs around 11/2020.  5) OSA: doing better since getting on CPAP."  INTERIM HX: Doing MUCH better, mood up more and stable, more motivated/energetic but not overly so. Notes mild dec appetite since inc fluox to 40 but still eating 1-2 meals a day and is not bothered by this. Sleep is fine, anxiety level fine. Has not used lorazepam much AT ALL.  She wonders if the pills she has are still ok to take if needed since they are from 2020. PMP AWARE reviewed today: most recent rx for lorazepam 0.5mg  was filled 12/25/18, # 60, rx by me. No red flags.   ROS: See pertinent positives and negatives per HPI.  Past Medical History:  Diagnosis Date  . Allergic rhinitis    allergy immunotherapy as of 2021  . Arthralgia of multiple sites    Multiple rheum w/u's neg.  01/2018 rheum eval #2->hypermobility arthralgias, pes planus severe  .  Depression   . GAD (generalized anxiety disorder)   . Insulin resistance 11/2019   11/2019->Insulin and C-peptide elevated.  Gluc 92, a1c 5.6%.  . OSA on CPAP summer 2021   dx'd in [place], 2022   . PSVT (paroxysmal supraventricular tachycardia) (HCC) 2021   Dx'd by cardiologist in [place] [facility]-->ablation was done in summer 2021.    Past Surgical History:  Procedure Laterality Date  . SUPRAVENTRICULAR TACHYCARDIA ABLATION  summer 2021  . WISDOM TOOTH EXTRACTION       Current Outpatient Medications:  .  FLUoxetine (PROZAC) 40 MG capsule, Take 1 capsule (40 mg total) by mouth daily., Disp: 30 capsule, Rfl: 0 .  SRONYX 0.1-20 MG-MCG tablet, TAKE 1 TABLET BY MOUTH EVERY DAY, Disp: 84 tablet, Rfl: 4 .  traMADol-acetaminophen (ULTRACET) 37.5-325 MG tablet, TAKE 1 TO 2 TABLETS BY MOUTH TWICE A DAY AS NEEDED FOR PAIN, Disp: 120 tablet, Rfl: 1 .  LORazepam (ATIVAN) 0.5 MG tablet, 1-2 tabs po bid prn anxiety and/or insomnia (Patient not taking: Reported on 07/19/2020), Disp: 60 tablet, Rfl: 5  EXAM:  VITALS per patient if applicable:  Vitals with BMI 12/01/2019 10/07/2019 12/08/2018  Height 5\' 2"  - 5\' 2"   Weight 195 lbs 180 lbs -  BMI 35.66 - -  Systolic - (No Data) -  Diastolic - (No Data) -  Pulse - (No Data) -     GENERAL: alert, oriented, appears well and in no acute distress  HEENT: atraumatic, conjunttiva clear, no obvious abnormalities on inspection of external nose and ears  NECK: normal movements of the head and neck  LUNGS: on inspection no signs of respiratory distress, breathing rate appears normal, no obvious gross SOB, gasping or wheezing  CV: no obvious cyanosis  MS: moves all visible extremities without noticeable abnormality  PSYCH/NEURO: pleasant and cooperative, no obvious depression or anxiety, speech and thought processing grossly intact  LABS: none today    Chemistry      Component Value Date/Time   NA 138 12/09/2019 1527   K 4.4 12/09/2019 1527   CL 104  12/09/2019 1527   CO2 25 12/09/2019 1527   BUN 8 12/09/2019 1527   CREATININE 0.89 12/09/2019 1527      Component Value Date/Time   CALCIUM 9.6 12/09/2019 1527   ALKPHOS 67 12/08/2017 1006   AST 19 12/09/2019 1527   ALT 26 12/09/2019 1527   BILITOT 0.2 12/09/2019 1527     Lab Results  Component Value Date   WBC 5.1 11/17/2014   HGB 12.4 11/17/2014   HCT 37.7 11/17/2014   MCV 79.3 11/17/2014   PLT 355.0 11/17/2014   Lab Results  Component Value Date   HGBA1C 5.6 12/09/2019   Lab Results  Component Value Date   CHOL 199 12/09/2019   HDL 50 12/09/2019   LDLCALC 124 (H) 12/09/2019   TRIG 130 12/09/2019   CHOLHDL 4.0 12/09/2019   ASSESSMENT AND PLAN:  Discussed the following assessment and plan:  Recurrent MDD, in remission now on fluoxetine 40mg  qd. Her anxiety and insomnia are well controlled as well.   I sent in #15 lorazepam 0.5mg  today to replace her expired pills----she uses this very infrequently but it works well when she does need it.  Chronic pain syndrome has been stable and we just followed up on this las month so we'll see her for next f/u in about 6 mo----will rpt UDS and update CSC at that time.   I discussed the assessment and treatment plan with the patient. The patient was provided an opportunity to ask questions and all were answered. The patient agreed with the plan and demonstrated an understanding of the instructions.   F/u: 6 mo f/u chronic pain syndrome + anx/dep  Signed:  , MD           07/19/2020

## 2021-06-12 LAB — HM PAP SMEAR: HM Pap smear: NORMAL

## 2021-07-10 ENCOUNTER — Other Ambulatory Visit: Payer: Self-pay | Admitting: Family Medicine

## 2021-07-24 ENCOUNTER — Other Ambulatory Visit: Payer: Self-pay | Admitting: Family Medicine

## 2022-04-23 NOTE — Progress Notes (Signed)
This encounter was created in error - please disregard.

## 2022-06-19 ENCOUNTER — Encounter: Payer: Self-pay | Admitting: Family Medicine

## 2022-06-19 ENCOUNTER — Ambulatory Visit (INDEPENDENT_AMBULATORY_CARE_PROVIDER_SITE_OTHER): Payer: Managed Care, Other (non HMO) | Admitting: Family Medicine

## 2022-06-19 VITALS — BP 110/72 | HR 69 | Temp 98.1°F | Ht 62.75 in | Wt 221.4 lb

## 2022-06-19 DIAGNOSIS — Z0000 Encounter for general adult medical examination without abnormal findings: Secondary | ICD-10-CM

## 2022-06-19 DIAGNOSIS — Z23 Encounter for immunization: Secondary | ICD-10-CM | POA: Diagnosis not present

## 2022-06-19 DIAGNOSIS — R7301 Impaired fasting glucose: Secondary | ICD-10-CM

## 2022-06-19 DIAGNOSIS — G894 Chronic pain syndrome: Secondary | ICD-10-CM

## 2022-06-19 DIAGNOSIS — F411 Generalized anxiety disorder: Secondary | ICD-10-CM

## 2022-06-19 LAB — BASIC METABOLIC PANEL
BUN: 8 mg/dL (ref 6–23)
CO2: 27 mEq/L (ref 19–32)
Calcium: 9.6 mg/dL (ref 8.4–10.5)
Chloride: 103 mEq/L (ref 96–112)
Creatinine, Ser: 1 mg/dL (ref 0.40–1.20)
GFR: 76.93 mL/min (ref >60.00)
Glucose, Bld: 79 mg/dL (ref 70–99)
Potassium: 4.7 mEq/L (ref 3.5–5.1)
Sodium: 138 mEq/L (ref 135–145)

## 2022-06-19 LAB — HEMOGLOBIN A1C: Hgb A1c MFr Bld: 5.7 % (ref 4.6–6.5)

## 2022-06-19 NOTE — Progress Notes (Signed)
Office Note 06/19/2022  CC:  Chief Complaint  Patient presents with   Annual Exam    Pt is fasting    HPI:  Patient is a 27 y.o. female who is here for annual health maintenance exam and follow-up depression and anxiety. I have not seen her in about 2 years.  At last check she was doing well on fluoxetine 40 mg a day.  She feels like she is doing well regarding mood and anxiety levels.  She rarely uses lorazepam, has a few of these still left. Also, her hips and lower extremities arthralgias are not bothering her nearly like they were, she uses approximately 3 Ultracet tabs a month.  Still has a few left.  PMP AWARE reviewed today: most recent rx for lorazepam was filled 07/19/2020, # 15, rx by me.  No Ultracet prescription fills are listed.  No red flags.  Past Medical History:  Diagnosis Date   Allergic rhinitis    allergy immunotherapy as of 2021   Arthralgia of multiple sites    Multiple rheum w/u's neg.  01/2018 rheum eval #2->hypermobility arthralgias, pes planus severe   Depression    GAD (generalized anxiety disorder)    Insulin resistance 11/2019   11/2019->Insulin and C-peptide elevated.  Gluc 92, a1c 5.6%.   OSA on CPAP summer 2021   dx'd in [place], [place]    PSVT (paroxysmal supraventricular tachycardia) 2021   Dx'd by cardiologist in [place]-->ablation was done in summer 2021.    Past Surgical History:  Procedure Laterality Date   SUPRAVENTRICULAR TACHYCARDIA ABLATION  summer 2021   WISDOM TOOTH EXTRACTION      Family History  Problem Relation Age of Onset   Thyroid disease Mother    Lupus Father    Hypertension Father    Arthritis Maternal Grandmother        osteo   Diabetes Maternal Grandmother    Alcohol abuse Maternal Grandfather    Lupus Paternal Grandmother    Hyperlipidemia Paternal Grandmother     Social History   Socioeconomic History   Marital status: Single    Spouse name: Not on file   Number of children: Not on file   Years  of education: Not on file   Highest education level: Not on file  Occupational History   Not on file  Tobacco Use   Smoking status: Never   Smokeless tobacco: Never  Vaping Use   Vaping Use: Never used  Substance and Sexual Activity   Alcohol use: Yes    Comment: occasional   Drug use: Not Currently    Types: Marijuana    Comment: occasional   Sexual activity: Yes    Birth control/protection: Pill  Other Topics Concern   Not on file  Social History Narrative   Not on file   Social Determinants of Health   Financial Resource Strain: Not on file  Food Insecurity: Not on file  Transportation Needs: Not on file  Physical Activity: Not on file  Stress: Not on file  Social Connections: Not on file  Intimate Partner Violence: Not on file    Outpatient Medications Prior to Visit  Medication Sig Dispense Refill   SRONYX 0.1-20 MG-MCG tablet TAKE 1 TABLET BY MOUTH EVERY DAY 84 tablet 4   traMADol-acetaminophen (ULTRACET) 37.5-325 MG tablet TAKE 1 TO 2 TABLETS BY MOUTH TWICE A DAY AS NEEDED FOR PAIN 120 tablet 1   LORazepam (ATIVAN) 0.5 MG tablet 1-2 tabs po bid prn anxiety and/or insomnia (Patient  not taking: Reported on 06/19/2022) 15 tablet 1   FLUoxetine (PROZAC) 40 MG capsule Take 1 capsule (40 mg total) by mouth daily. OFFICE VISIT NEEDED FOR FURTHER REFILLS (Patient not taking: Reported on 06/19/2022) 30 capsule 0   No facility-administered medications prior to visit.    Allergies  Allergen Reactions   Cephalexin Hives   Wellbutrin [Bupropion] Other (See Comments)    Irritable, emotional, some passive suicidal thoughts    Review of Systems  Constitutional:  Negative for appetite change, chills, fatigue and fever.  HENT:  Negative for congestion, dental problem, ear pain and sore throat.   Eyes:  Negative for discharge, redness and visual disturbance.  Respiratory:  Negative for cough, chest tightness, shortness of breath and wheezing.   Cardiovascular:  Negative for  chest pain, palpitations and leg swelling.  Gastrointestinal:  Negative for abdominal pain, blood in stool, diarrhea, nausea and vomiting.  Genitourinary:  Negative for difficulty urinating, dysuria, flank pain, frequency, hematuria and urgency.  Musculoskeletal:  Positive for arthralgias (mostly hips,knees, ankles/feet-->hypermobility pain). Negative for back pain, joint swelling, myalgias and neck stiffness.  Skin:  Negative for pallor and rash.  Neurological:  Negative for dizziness, speech difficulty, weakness and headaches.  Hematological:  Negative for adenopathy. Does not bruise/bleed easily.  Psychiatric/Behavioral:  Negative for confusion and sleep disturbance. The patient is not nervous/anxious.     PE;    06/19/2022    1:10 PM 12/01/2019    4:07 PM 10/07/2019    1:54 PM  Vitals with BMI  Height 5' 2.75" 5' 2"$    Weight 221 lbs 6 oz 195 lbs 180 lbs  BMI 123456 [id number]   Systolic [id number]    Diastolic 72    Pulse 69      Exam chaperoned by Deveron Furlong, CMA. Gen: Alert, well appearing.  Patient is oriented to person, place, time, and situation. AFFECT: pleasant, lucid thought and speech. ENT: Ears: EACs clear, normal epithelium.  TMs with good light reflex and landmarks bilaterally.  Eyes: no injection, icteris, swelling, or exudate.  EOMI, PERRLA. Nose: no drainage or turbinate edema/swelling.  No injection or focal lesion.  Mouth: lips without lesion/swelling.  Oral mucosa pink and moist.  Dentition intact and without obvious caries or gingival swelling.  Oropharynx without erythema, exudate, or swelling.  Neck: supple/nontender.  No LAD, mass, or TM.  Carotid pulses 2+ bilaterally, without bruits. CV: RRR, no m/r/g.   LUNGS: CTA bilat, nonlabored resps, good aeration in all lung fields. ABD: soft, NT, ND, BS normal.  No hepatospenomegaly or mass.  No bruits. EXT: no clubbing, cyanosis, or edema.  Musculoskeletal: no joint swelling, erythema, warmth, or tenderness.  ROM of all  joints intact. Skin - no sores or suspicious lesions or rashes or color changes  Pertinent labs:  Lab Results  Component Value Date   TSH 3.56 12/09/2019   Lab Results  Component Value Date   WBC 5.1 11/17/2014   HGB 12.4 11/17/2014   HCT 37.7 11/17/2014   MCV 79.3 11/17/2014   PLT 355.0 11/17/2014   Lab Results  Component Value Date   CREATININE 0.89 12/09/2019   BUN 8 12/09/2019   NA 138 12/09/2019   K 4.4 12/09/2019   CL 104 12/09/2019   CO2 25 12/09/2019   Lab Results  Component Value Date   ALT 26 12/09/2019   AST 19 12/09/2019   ALKPHOS 67 12/08/2017   BILITOT 0.2 12/09/2019   Lab Results  Component Value Date   CHOL  199 12/09/2019   Lab Results  Component Value Date   HDL 50 12/09/2019   Lab Results  Component Value Date   LDLCALC 124 (H) 12/09/2019   Lab Results  Component Value Date   TRIG 130 12/09/2019   Lab Results  Component Value Date   CHOLHDL 4.0 12/09/2019   Lab Results  Component Value Date   HGBA1C 5.6 12/09/2019   ASSESSMENT AND PLAN:   #1 health maintenance exam: Reviewed age and gender appropriate health maintenance issues (prudent diet, regular exercise, health risks of tobacco and excessive alcohol, use of seatbelts, fire alarms in home, use of sunscreen).  Also reviewed age and gender appropriate health screening as well as vaccine recommendations. Vaccines:  flu today.  Tdap utd. Labs: bmet and Hba1c ordered ([facility]). Cervical ca screening: pap nl last year.  Has appt with her GYN next month.  #2 chronic pain syndrome: Hypermobility pain. She rarely uses Ultracet.  New prescription was not needed today.  She will call if she needs this filled in the future.  3.  History of depression and anxiety. She is doing well off antidepressant and rarely uses her lorazepam. No new prescription was needed today.  #4 impaired fasting glucose. Discussed the importance of lowering carbohydrates in her diet and starting with some  nonweightbearing cardiovascular exercises. Monitor bmet and hemoglobin A1c today.  An After Visit Summary was printed and given to the patient.  FOLLOW UP:  Return in about 6 months (around 12/18/2022) for routine chronic illness f/u.  Signed:  Crissie Sickles, MD           06/19/2022

## 2022-06-19 NOTE — Patient Instructions (Signed)

## 2022-11-26 ENCOUNTER — Encounter (INDEPENDENT_AMBULATORY_CARE_PROVIDER_SITE_OTHER): Payer: Self-pay

## 2022-12-22 ENCOUNTER — Ambulatory Visit: Payer: Self-pay | Admitting: Family Medicine

## 2023-07-17 ENCOUNTER — Telehealth: Payer: Self-pay

## 2023-07-17 NOTE — Transitions of Care (Post Inpatient/ED Visit) (Signed)
   07/17/2023  Name: Tina Ortiz MRN: [id number] DOB: [birth year]  Today's TOC FU Call Status: Today's TOC FU Call Status:: Unsuccessful Call (1st Attempt) Unsuccessful Call (1st Attempt) Date: 07/17/23  Attempted to reach the patient regarding the most recent Inpatient/ED visit.  Follow Up Plan: Additional outreach attempts will be made to reach the patient to complete the Transitions of Care (Post Inpatient/ED visit) call.   Signature Karena Addison, LPN [facility] Nurse Health Advisor Direct Dial [phone]

## 2023-07-20 NOTE — Transitions of Care (Post Inpatient/ED Visit) (Unsigned)
   07/20/2023  Name: Tina Ortiz MRN: [id number] DOB: [birth year]  Today's TOC FU Call Status: Today's TOC FU Call Status:: Unsuccessful Call (2nd Attempt) Unsuccessful Call (1st Attempt) Date: 07/17/23 Unsuccessful Call (2nd Attempt) Date: 07/20/23  Attempted to reach the patient regarding the most recent Inpatient/ED visit.  Follow Up Plan: Additional outreach attempts will be made to reach the patient to complete the Transitions of Care (Post Inpatient/ED visit) call.   Signature Karena Addison, LPN [facility] Nurse Health Advisor Direct Dial [phone]

## 2023-07-21 NOTE — Transitions of Care (Post Inpatient/ED Visit) (Signed)
   07/21/2023  Name: Tina Ortiz MRN: [id number] DOB: [birth year]  Today's TOC FU Call Status: Today's TOC FU Call Status:: Unsuccessful Call (3rd Attempt) Unsuccessful Call (1st Attempt) Date: 07/17/23 Unsuccessful Call (2nd Attempt) Date: 07/20/23 Unsuccessful Call (3rd Attempt) Date: 07/21/23  Attempted to reach the patient regarding the most recent Inpatient/ED visit.  Follow Up Plan: No further outreach attempts will be made at this time. We have been unable to contact the patient.  Signature Karena Addison, LPN [facility] Nurse Health Advisor Direct Dial [phone]
